# Patient Record
Sex: Male | Born: 1950 | Race: White | Hispanic: No
Health system: Southern US, Community
[De-identification: ages and names within clinical notes are randomized; demographics above are authoritative.]

## PROBLEM LIST (undated history)

## (undated) DIAGNOSIS — F419 Anxiety disorder, unspecified: Secondary | ICD-10-CM

## (undated) DIAGNOSIS — J449 Chronic obstructive pulmonary disease, unspecified: Secondary | ICD-10-CM

## (undated) DIAGNOSIS — K5909 Other constipation: Secondary | ICD-10-CM

## (undated) DIAGNOSIS — I219 Acute myocardial infarction, unspecified: Secondary | ICD-10-CM

## (undated) DIAGNOSIS — F319 Bipolar disorder, unspecified: Secondary | ICD-10-CM

## (undated) DIAGNOSIS — F329 Major depressive disorder, single episode, unspecified: Secondary | ICD-10-CM

## (undated) DIAGNOSIS — E785 Hyperlipidemia, unspecified: Secondary | ICD-10-CM

## (undated) DIAGNOSIS — I2510 Atherosclerotic heart disease of native coronary artery without angina pectoris: Secondary | ICD-10-CM

## (undated) DIAGNOSIS — I10 Essential (primary) hypertension: Secondary | ICD-10-CM

## (undated) DIAGNOSIS — N400 Enlarged prostate without lower urinary tract symptoms: Secondary | ICD-10-CM

## (undated) DIAGNOSIS — F32A Depression, unspecified: Secondary | ICD-10-CM

## (undated) HISTORY — PX: TRACHEOSTOMY: SUR1362

## (undated) HISTORY — PX: AORTIC VALVE REPLACEMENT (AVR)/CORONARY ARTERY BYPASS GRAFTING (CABG): SHX5725

## (undated) HISTORY — PX: CHOLECYSTECTOMY: SHX55

## (undated) HISTORY — PX: GASTROSTOMY: SHX151

## (undated) HISTORY — PX: NEPHRECTOMY: SHX65

---

## 2016-12-08 ENCOUNTER — Other Ambulatory Visit (HOSPITAL_COMMUNITY): Payer: Medicaid Other

## 2016-12-08 ENCOUNTER — Inpatient Hospital Stay
Admission: RE | Admit: 2016-12-08 | Discharge: 2017-01-17 | Disposition: A | Discharge status: Other Acute Inpatient Hospital | Payer: Medicaid Other | Attending: Internal Medicine | Admitting: Internal Medicine

## 2016-12-08 DIAGNOSIS — R1110 Vomiting, unspecified: Secondary | ICD-10-CM

## 2016-12-08 DIAGNOSIS — Z931 Gastrostomy status: Secondary | ICD-10-CM

## 2016-12-08 DIAGNOSIS — K567 Ileus, unspecified: Secondary | ICD-10-CM

## 2016-12-08 DIAGNOSIS — J9690 Respiratory failure, unspecified, unspecified whether with hypoxia or hypercapnia: Secondary | ICD-10-CM

## 2016-12-08 LAB — BLOOD GAS, ARTERIAL
ACID-BASE EXCESS: 3 mmol/L — AB (ref 0.0–2.0)
BICARBONATE: 26.6 mmol/L (ref 20.0–28.0)
FIO2: 28
LHR: 15 {breaths}/min
O2 Saturation: 94.1 %
PEEP: 5 cmH2O
PO2 ART: 73.4 mmHg — AB (ref 83.0–108.0)
Patient temperature: 99.2
VT: 460 mL
pCO2 arterial: 38.7 mmHg (ref 32.0–48.0)
pH, Arterial: 7.453 — ABNORMAL HIGH (ref 7.350–7.450)

## 2016-12-08 MED ORDER — IOPAMIDOL (ISOVUE-300) INJECTION 61%
50.0000 mL | Freq: Once | INTRAVENOUS | Status: AC | PRN
  Administered 2016-12-08: 50 mL

## 2016-12-08 MED ORDER — IOPAMIDOL (ISOVUE-300) INJECTION 61%
INTRAVENOUS | Status: AC
  Filled 2016-12-08: qty 150

## 2016-12-09 LAB — URINALYSIS, ROUTINE W REFLEX MICROSCOPIC
BILIRUBIN URINE: NEGATIVE
Glucose, UA: NEGATIVE mg/dL
HGB URINE DIPSTICK: NEGATIVE
Ketones, ur: NEGATIVE mg/dL
Nitrite: NEGATIVE
Protein, ur: NEGATIVE mg/dL
Specific Gravity, Urine: 1.017 (ref 1.005–1.030)
pH: 6 (ref 5.0–8.0)

## 2016-12-09 LAB — CBC WITH DIFFERENTIAL/PLATELET
BASOS PCT: 0 %
Basophils Absolute: 0 10*3/uL (ref 0.0–0.1)
EOS PCT: 6 %
Eosinophils Absolute: 0.4 10*3/uL (ref 0.0–0.7)
HEMATOCRIT: 31.6 % — AB (ref 39.0–52.0)
HEMOGLOBIN: 10 g/dL — AB (ref 13.0–17.0)
LYMPHS PCT: 13 %
Lymphs Abs: 0.9 10*3/uL (ref 0.7–4.0)
MCH: 31.4 pg (ref 26.0–34.0)
MCHC: 31.6 g/dL (ref 30.0–36.0)
MCV: 99.4 fL (ref 78.0–100.0)
MONOS PCT: 9 %
Monocytes Absolute: 0.7 10*3/uL (ref 0.1–1.0)
NEUTROS ABS: 5.3 10*3/uL (ref 1.7–7.7)
Neutrophils Relative %: 72 %
Platelets: 135 10*3/uL — ABNORMAL LOW (ref 150–400)
RBC: 3.18 MIL/uL — ABNORMAL LOW (ref 4.22–5.81)
RDW: 16 % — ABNORMAL HIGH (ref 11.5–15.5)
WBC: 7.3 10*3/uL (ref 4.0–10.5)

## 2016-12-09 LAB — PROTIME-INR
INR: 1.34
Prothrombin Time: 16.5 seconds — ABNORMAL HIGH (ref 11.4–15.2)

## 2016-12-09 LAB — COMPREHENSIVE METABOLIC PANEL
ALBUMIN: 2.8 g/dL — AB (ref 3.5–5.0)
ALT: 59 U/L (ref 17–63)
ANION GAP: 9 (ref 5–15)
AST: 55 U/L — ABNORMAL HIGH (ref 15–41)
Alkaline Phosphatase: 67 U/L (ref 38–126)
BILIRUBIN TOTAL: 1 mg/dL (ref 0.3–1.2)
BUN: 31 mg/dL — ABNORMAL HIGH (ref 6–20)
CHLORIDE: 109 mmol/L (ref 101–111)
CO2: 23 mmol/L (ref 22–32)
Calcium: 11.7 mg/dL — ABNORMAL HIGH (ref 8.9–10.3)
Creatinine, Ser: 0.55 mg/dL — ABNORMAL LOW (ref 0.61–1.24)
Glucose, Bld: 97 mg/dL (ref 65–99)
POTASSIUM: 4.7 mmol/L (ref 3.5–5.1)
SODIUM: 141 mmol/L (ref 135–145)
TOTAL PROTEIN: 6.1 g/dL — AB (ref 6.5–8.1)

## 2016-12-09 LAB — HEMOGLOBIN A1C
HEMOGLOBIN A1C: 5.2 % (ref 4.8–5.6)
MEAN PLASMA GLUCOSE: 102.54 mg/dL

## 2016-12-09 LAB — C DIFFICILE QUICK SCREEN W PCR REFLEX
C DIFFICLE (CDIFF) ANTIGEN: NEGATIVE
C Diff interpretation: NOT DETECTED
C Diff toxin: NEGATIVE

## 2016-12-09 LAB — MAGNESIUM: MAGNESIUM: 1.8 mg/dL (ref 1.7–2.4)

## 2016-12-09 LAB — TSH: TSH: 2.141 u[IU]/mL (ref 0.350–4.500)

## 2016-12-09 LAB — PHOSPHORUS: Phosphorus: 1.8 mg/dL — ABNORMAL LOW (ref 2.5–4.6)

## 2016-12-10 LAB — BASIC METABOLIC PANEL
Anion gap: 7 (ref 5–15)
BUN: 39 mg/dL — ABNORMAL HIGH (ref 6–20)
CHLORIDE: 109 mmol/L (ref 101–111)
CO2: 25 mmol/L (ref 22–32)
CREATININE: 0.6 mg/dL — AB (ref 0.61–1.24)
Calcium: 11.6 mg/dL — ABNORMAL HIGH (ref 8.9–10.3)
Glucose, Bld: 108 mg/dL — ABNORMAL HIGH (ref 65–99)
Potassium: 4.8 mmol/L (ref 3.5–5.1)
SODIUM: 141 mmol/L (ref 135–145)

## 2016-12-10 LAB — CBC
HCT: 30.7 % — ABNORMAL LOW (ref 39.0–52.0)
Hemoglobin: 9.4 g/dL — ABNORMAL LOW (ref 13.0–17.0)
MCH: 30.5 pg (ref 26.0–34.0)
MCHC: 30.6 g/dL (ref 30.0–36.0)
MCV: 99.7 fL (ref 78.0–100.0)
PLATELETS: 119 10*3/uL — AB (ref 150–400)
RBC: 3.08 MIL/uL — AB (ref 4.22–5.81)
RDW: 15.5 % (ref 11.5–15.5)
WBC: 8.8 10*3/uL (ref 4.0–10.5)

## 2016-12-10 LAB — URINE CULTURE: Culture: NO GROWTH

## 2016-12-11 LAB — BASIC METABOLIC PANEL
Anion gap: 7 (ref 5–15)
BUN: 36 mg/dL — ABNORMAL HIGH (ref 6–20)
CALCIUM: 12.3 mg/dL — AB (ref 8.9–10.3)
CO2: 27 mmol/L (ref 22–32)
CREATININE: 0.65 mg/dL (ref 0.61–1.24)
Chloride: 108 mmol/L (ref 101–111)
Glucose, Bld: 141 mg/dL — ABNORMAL HIGH (ref 65–99)
Potassium: 4.2 mmol/L (ref 3.5–5.1)
SODIUM: 142 mmol/L (ref 135–145)

## 2016-12-11 LAB — MAGNESIUM: Magnesium: 2 mg/dL (ref 1.7–2.4)

## 2016-12-11 LAB — CBC
HEMATOCRIT: 32.5 % — AB (ref 39.0–52.0)
Hemoglobin: 10.1 g/dL — ABNORMAL LOW (ref 13.0–17.0)
MCH: 31.1 pg (ref 26.0–34.0)
MCHC: 31.1 g/dL (ref 30.0–36.0)
MCV: 100 fL (ref 78.0–100.0)
PLATELETS: 140 10*3/uL — AB (ref 150–400)
RBC: 3.25 MIL/uL — ABNORMAL LOW (ref 4.22–5.81)
RDW: 15.6 % — AB (ref 11.5–15.5)
WBC: 9.8 10*3/uL (ref 4.0–10.5)

## 2016-12-11 LAB — CULTURE, RESPIRATORY W GRAM STAIN

## 2016-12-11 LAB — CK TOTAL AND CKMB (NOT AT ARMC)
CK TOTAL: 16 U/L — AB (ref 49–397)
CK, MB: 1.8 ng/mL (ref 0.5–5.0)
RELATIVE INDEX: INVALID (ref 0.0–2.5)

## 2016-12-11 LAB — CULTURE, RESPIRATORY

## 2016-12-11 MED FILL — Medication: Qty: 1 | Status: AC

## 2016-12-13 ENCOUNTER — Other Ambulatory Visit (HOSPITAL_COMMUNITY): Payer: Medicaid Other

## 2016-12-13 LAB — BASIC METABOLIC PANEL
Anion gap: 9 (ref 5–15)
Anion gap: 9 (ref 5–15)
BUN: 43 mg/dL — AB (ref 6–20)
BUN: 47 mg/dL — AB (ref 6–20)
CALCIUM: 13 mg/dL — AB (ref 8.9–10.3)
CHLORIDE: 100 mmol/L — AB (ref 101–111)
CO2: 36 mmol/L — ABNORMAL HIGH (ref 22–32)
CO2: 37 mmol/L — ABNORMAL HIGH (ref 22–32)
CREATININE: 0.78 mg/dL (ref 0.61–1.24)
Calcium: 12.5 mg/dL — ABNORMAL HIGH (ref 8.9–10.3)
Chloride: 97 mmol/L — ABNORMAL LOW (ref 101–111)
Creatinine, Ser: 1.05 mg/dL (ref 0.61–1.24)
GFR calc Af Amer: >60 mL/min (ref >60)
GLUCOSE: 127 mg/dL — AB (ref 65–99)
Glucose, Bld: 120 mg/dL — ABNORMAL HIGH (ref 65–99)
POTASSIUM: 3.3 mmol/L — AB (ref 3.5–5.1)
Potassium: 3.8 mmol/L (ref 3.5–5.1)
SODIUM: 145 mmol/L (ref 135–145)
Sodium: 143 mmol/L (ref 135–145)

## 2016-12-13 LAB — URINALYSIS, ROUTINE W REFLEX MICROSCOPIC
BILIRUBIN URINE: NEGATIVE
Glucose, UA: NEGATIVE mg/dL
Hgb urine dipstick: NEGATIVE
Ketones, ur: NEGATIVE mg/dL
Nitrite: NEGATIVE
Protein, ur: 100 mg/dL — AB
SPECIFIC GRAVITY, URINE: 1.021 (ref 1.005–1.030)
Squamous Epithelial / LPF: NONE SEEN
pH: 5 (ref 5.0–8.0)

## 2016-12-13 LAB — MAGNESIUM: Magnesium: 1.9 mg/dL (ref 1.7–2.4)

## 2016-12-13 LAB — CBC
HCT: 34.5 % — ABNORMAL LOW (ref 39.0–52.0)
Hemoglobin: 10.5 g/dL — ABNORMAL LOW (ref 13.0–17.0)
MCH: 30.3 pg (ref 26.0–34.0)
MCHC: 30.4 g/dL (ref 30.0–36.0)
MCV: 99.4 fL (ref 78.0–100.0)
PLATELETS: 217 10*3/uL (ref 150–400)
RBC: 3.47 MIL/uL — ABNORMAL LOW (ref 4.22–5.81)
RDW: 15.5 % (ref 11.5–15.5)
WBC: 10.7 10*3/uL — ABNORMAL HIGH (ref 4.0–10.5)

## 2016-12-13 MED ORDER — IOPAMIDOL (ISOVUE-300) INJECTION 61%
100.0000 mL | Freq: Once | INTRAVENOUS | Status: AC | PRN
  Administered 2016-12-13: 100 mL via INTRAVENOUS

## 2016-12-14 ENCOUNTER — Other Ambulatory Visit (HOSPITAL_COMMUNITY): Payer: Medicaid Other

## 2016-12-14 LAB — URINE CULTURE: CULTURE: NO GROWTH

## 2016-12-14 LAB — MAGNESIUM: Magnesium: 2 mg/dL (ref 1.7–2.4)

## 2016-12-15 ENCOUNTER — Other Ambulatory Visit (HOSPITAL_COMMUNITY): Payer: Medicaid Other

## 2016-12-15 LAB — BASIC METABOLIC PANEL
ANION GAP: 9 (ref 5–15)
BUN: 40 mg/dL — AB (ref 6–20)
CALCIUM: 11.9 mg/dL — AB (ref 8.9–10.3)
CO2: 38 mmol/L — ABNORMAL HIGH (ref 22–32)
Chloride: 97 mmol/L — ABNORMAL LOW (ref 101–111)
Creatinine, Ser: 0.85 mg/dL (ref 0.61–1.24)
GLUCOSE: 110 mg/dL — AB (ref 65–99)
POTASSIUM: 3.2 mmol/L — AB (ref 3.5–5.1)
SODIUM: 144 mmol/L (ref 135–145)

## 2016-12-15 LAB — CULTURE, BLOOD (ROUTINE X 2)
Culture: NO GROWTH
Culture: NO GROWTH
SPECIAL REQUESTS: ADEQUATE
Special Requests: ADEQUATE

## 2016-12-15 LAB — MAGNESIUM: MAGNESIUM: 1.9 mg/dL (ref 1.7–2.4)

## 2016-12-16 ENCOUNTER — Other Ambulatory Visit (HOSPITAL_COMMUNITY): Payer: Medicaid Other

## 2016-12-16 LAB — OCCULT BLOOD X 1 CARD TO LAB, STOOL: FECAL OCCULT BLD: NEGATIVE

## 2016-12-16 LAB — BASIC METABOLIC PANEL
ANION GAP: 8 (ref 5–15)
BUN: 31 mg/dL — ABNORMAL HIGH (ref 6–20)
CHLORIDE: 98 mmol/L — AB (ref 101–111)
CO2: 34 mmol/L — ABNORMAL HIGH (ref 22–32)
Calcium: 11.2 mg/dL — ABNORMAL HIGH (ref 8.9–10.3)
Creatinine, Ser: 0.83 mg/dL (ref 0.61–1.24)
GFR calc Af Amer: >60 mL/min (ref >60)
Glucose, Bld: 94 mg/dL (ref 65–99)
POTASSIUM: 2.9 mmol/L — AB (ref 3.5–5.1)
SODIUM: 140 mmol/L (ref 135–145)

## 2016-12-16 LAB — MAGNESIUM
MAGNESIUM: 1.8 mg/dL (ref 1.7–2.4)
MAGNESIUM: 1.9 mg/dL (ref 1.7–2.4)

## 2016-12-16 LAB — POTASSIUM: POTASSIUM: 4.1 mmol/L (ref 3.5–5.1)

## 2016-12-17 LAB — CBC
HEMATOCRIT: 27.5 % — AB (ref 39.0–52.0)
HEMOGLOBIN: 8.6 g/dL — AB (ref 13.0–17.0)
MCH: 30.9 pg (ref 26.0–34.0)
MCHC: 31.3 g/dL (ref 30.0–36.0)
MCV: 98.9 fL (ref 78.0–100.0)
Platelets: 170 10*3/uL (ref 150–400)
RBC: 2.78 MIL/uL — ABNORMAL LOW (ref 4.22–5.81)
RDW: 15.6 % — AB (ref 11.5–15.5)
WBC: 7.5 10*3/uL (ref 4.0–10.5)

## 2016-12-17 LAB — BASIC METABOLIC PANEL
Anion gap: 8 (ref 5–15)
BUN: 24 mg/dL — AB (ref 6–20)
CHLORIDE: 99 mmol/L — AB (ref 101–111)
CO2: 32 mmol/L (ref 22–32)
CREATININE: 0.91 mg/dL (ref 0.61–1.24)
Calcium: 11.1 mg/dL — ABNORMAL HIGH (ref 8.9–10.3)
GFR calc Af Amer: >60 mL/min (ref >60)
GFR calc non Af Amer: >60 mL/min (ref >60)
GLUCOSE: 89 mg/dL (ref 65–99)
Potassium: 3.4 mmol/L — ABNORMAL LOW (ref 3.5–5.1)
Sodium: 139 mmol/L (ref 135–145)

## 2016-12-18 LAB — BASIC METABOLIC PANEL
ANION GAP: 6 (ref 5–15)
BUN: 24 mg/dL — AB (ref 6–20)
CALCIUM: 11.3 mg/dL — AB (ref 8.9–10.3)
CO2: 33 mmol/L — AB (ref 22–32)
Chloride: 101 mmol/L (ref 101–111)
Creatinine, Ser: 0.91 mg/dL (ref 0.61–1.24)
GFR calc Af Amer: >60 mL/min (ref >60)
GFR calc non Af Amer: >60 mL/min (ref >60)
GLUCOSE: 100 mg/dL — AB (ref 65–99)
Potassium: 4 mmol/L (ref 3.5–5.1)
Sodium: 140 mmol/L (ref 135–145)

## 2016-12-19 LAB — BASIC METABOLIC PANEL
ANION GAP: 7 (ref 5–15)
BUN: 21 mg/dL — AB (ref 6–20)
CHLORIDE: 102 mmol/L (ref 101–111)
CO2: 28 mmol/L (ref 22–32)
CREATININE: 0.66 mg/dL (ref 0.61–1.24)
Calcium: 10.8 mg/dL — ABNORMAL HIGH (ref 8.9–10.3)
GFR calc non Af Amer: >60 mL/min (ref >60)
GLUCOSE: 105 mg/dL — AB (ref 65–99)
Potassium: 3.5 mmol/L (ref 3.5–5.1)
SODIUM: 137 mmol/L (ref 135–145)

## 2016-12-19 LAB — CBC
HCT: 26.5 % — ABNORMAL LOW (ref 39.0–52.0)
HEMOGLOBIN: 8.2 g/dL — AB (ref 13.0–17.0)
MCH: 30.4 pg (ref 26.0–34.0)
MCHC: 30.9 g/dL (ref 30.0–36.0)
MCV: 98.1 fL (ref 78.0–100.0)
Platelets: 130 10*3/uL — ABNORMAL LOW (ref 150–400)
RBC: 2.7 MIL/uL — ABNORMAL LOW (ref 4.22–5.81)
RDW: 16 % — AB (ref 11.5–15.5)
WBC: 6.9 10*3/uL (ref 4.0–10.5)

## 2016-12-19 LAB — C DIFFICILE QUICK SCREEN W PCR REFLEX
C DIFFICILE (CDIFF) INTERP: NOT DETECTED
C Diff antigen: NEGATIVE
C Diff toxin: NEGATIVE

## 2016-12-19 LAB — MAGNESIUM: Magnesium: 1.5 mg/dL — ABNORMAL LOW (ref 1.7–2.4)

## 2016-12-19 LAB — PHOSPHORUS: Phosphorus: 1.8 mg/dL — ABNORMAL LOW (ref 2.5–4.6)

## 2016-12-20 LAB — CBC
HEMATOCRIT: 18.5 % — AB (ref 39.0–52.0)
Hemoglobin: 5.9 g/dL — CL (ref 13.0–17.0)
MCH: 31.1 pg (ref 26.0–34.0)
MCHC: 31.9 g/dL (ref 30.0–36.0)
MCV: 97.4 fL (ref 78.0–100.0)
Platelets: 142 10*3/uL — ABNORMAL LOW (ref 150–400)
RBC: 1.9 MIL/uL — AB (ref 4.22–5.81)
RDW: 16.3 % — ABNORMAL HIGH (ref 11.5–15.5)
WBC: 7.4 10*3/uL (ref 4.0–10.5)

## 2016-12-20 LAB — BASIC METABOLIC PANEL
ANION GAP: 6 (ref 5–15)
BUN: 39 mg/dL — ABNORMAL HIGH (ref 6–20)
CHLORIDE: 104 mmol/L (ref 101–111)
CO2: 27 mmol/L (ref 22–32)
Calcium: 10.4 mg/dL — ABNORMAL HIGH (ref 8.9–10.3)
Creatinine, Ser: 0.94 mg/dL (ref 0.61–1.24)
GFR calc Af Amer: >60 mL/min (ref >60)
GFR calc non Af Amer: >60 mL/min (ref >60)
GLUCOSE: 108 mg/dL — AB (ref 65–99)
POTASSIUM: 3 mmol/L — AB (ref 3.5–5.1)
Sodium: 137 mmol/L (ref 135–145)

## 2016-12-20 LAB — MAGNESIUM: Magnesium: 1.9 mg/dL (ref 1.7–2.4)

## 2016-12-20 LAB — PHOSPHORUS: Phosphorus: 2.5 mg/dL (ref 2.5–4.6)

## 2016-12-20 LAB — OCCULT BLOOD X 1 CARD TO LAB, STOOL: Fecal Occult Bld: POSITIVE — AB

## 2016-12-20 LAB — ABO/RH: ABO/RH(D): AB POS

## 2016-12-20 LAB — PREPARE RBC (CROSSMATCH)

## 2016-12-21 LAB — CBC
HEMATOCRIT: 25.4 % — AB (ref 39.0–52.0)
HEMOGLOBIN: 8.2 g/dL — AB (ref 13.0–17.0)
MCH: 29.8 pg (ref 26.0–34.0)
MCHC: 32.3 g/dL (ref 30.0–36.0)
MCV: 92.4 fL (ref 78.0–100.0)
Platelets: 134 10*3/uL — ABNORMAL LOW (ref 150–400)
RBC: 2.75 MIL/uL — ABNORMAL LOW (ref 4.22–5.81)
RDW: 17.6 % — ABNORMAL HIGH (ref 11.5–15.5)
WBC: 8.4 10*3/uL (ref 4.0–10.5)

## 2016-12-21 LAB — BASIC METABOLIC PANEL
Anion gap: 6 (ref 5–15)
BUN: 57 mg/dL — ABNORMAL HIGH (ref 6–20)
CALCIUM: 10.9 mg/dL — AB (ref 8.9–10.3)
CHLORIDE: 106 mmol/L (ref 101–111)
CO2: 27 mmol/L (ref 22–32)
CREATININE: 1.06 mg/dL (ref 0.61–1.24)
GFR calc Af Amer: >60 mL/min (ref >60)
GFR calc non Af Amer: >60 mL/min (ref >60)
GLUCOSE: 108 mg/dL — AB (ref 65–99)
Potassium: 3.6 mmol/L (ref 3.5–5.1)
SODIUM: 139 mmol/L (ref 135–145)

## 2016-12-21 LAB — PHOSPHORUS: PHOSPHORUS: 2 mg/dL — AB (ref 2.5–4.6)

## 2016-12-21 LAB — CULTURE, RESPIRATORY W GRAM STAIN: Culture: NORMAL

## 2016-12-21 LAB — CULTURE, RESPIRATORY

## 2016-12-21 LAB — MAGNESIUM: Magnesium: 1.8 mg/dL (ref 1.7–2.4)

## 2016-12-22 LAB — CBC
HEMATOCRIT: 23.1 % — AB (ref 39.0–52.0)
Hemoglobin: 7.4 g/dL — ABNORMAL LOW (ref 13.0–17.0)
MCH: 30 pg (ref 26.0–34.0)
MCHC: 32 g/dL (ref 30.0–36.0)
MCV: 93.5 fL (ref 78.0–100.0)
Platelets: 128 10*3/uL — ABNORMAL LOW (ref 150–400)
RBC: 2.47 MIL/uL — AB (ref 4.22–5.81)
RDW: 18.1 % — AB (ref 11.5–15.5)
WBC: 7.3 10*3/uL (ref 4.0–10.5)

## 2016-12-22 LAB — BASIC METABOLIC PANEL
ANION GAP: 6 (ref 5–15)
BUN: 68 mg/dL — ABNORMAL HIGH (ref 6–20)
CALCIUM: 11.4 mg/dL — AB (ref 8.9–10.3)
CO2: 26 mmol/L (ref 22–32)
Chloride: 108 mmol/L (ref 101–111)
Creatinine, Ser: 1.09 mg/dL (ref 0.61–1.24)
Glucose, Bld: 119 mg/dL — ABNORMAL HIGH (ref 65–99)
POTASSIUM: 4 mmol/L (ref 3.5–5.1)
Sodium: 140 mmol/L (ref 135–145)

## 2016-12-22 LAB — MAGNESIUM: Magnesium: 1.7 mg/dL (ref 1.7–2.4)

## 2016-12-22 LAB — PHOSPHORUS: PHOSPHORUS: 2.3 mg/dL — AB (ref 2.5–4.6)

## 2016-12-23 LAB — MAGNESIUM: Magnesium: 1.7 mg/dL (ref 1.7–2.4)

## 2016-12-23 LAB — BASIC METABOLIC PANEL
Anion gap: 6 (ref 5–15)
BUN: 56 mg/dL — AB (ref 6–20)
CALCIUM: 11.5 mg/dL — AB (ref 8.9–10.3)
CHLORIDE: 109 mmol/L (ref 101–111)
CO2: 29 mmol/L (ref 22–32)
CREATININE: 1.17 mg/dL (ref 0.61–1.24)
GFR calc Af Amer: >60 mL/min (ref >60)
GFR calc non Af Amer: >60 mL/min (ref >60)
GLUCOSE: 120 mg/dL — AB (ref 65–99)
Potassium: 4.4 mmol/L (ref 3.5–5.1)
Sodium: 144 mmol/L (ref 135–145)

## 2016-12-23 LAB — CBC
HCT: 21.5 % — ABNORMAL LOW (ref 39.0–52.0)
HEMOGLOBIN: 6.9 g/dL — AB (ref 13.0–17.0)
MCH: 30.5 pg (ref 26.0–34.0)
MCHC: 32.1 g/dL (ref 30.0–36.0)
MCV: 95.1 fL (ref 78.0–100.0)
Platelets: 106 10*3/uL — ABNORMAL LOW (ref 150–400)
RBC: 2.26 MIL/uL — ABNORMAL LOW (ref 4.22–5.81)
RDW: 18 % — ABNORMAL HIGH (ref 11.5–15.5)
WBC: 6 10*3/uL (ref 4.0–10.5)

## 2016-12-23 LAB — PREPARE RBC (CROSSMATCH)

## 2016-12-24 LAB — BASIC METABOLIC PANEL
ANION GAP: 8 (ref 5–15)
BUN: 55 mg/dL — AB (ref 6–20)
CALCIUM: 11.4 mg/dL — AB (ref 8.9–10.3)
CO2: 25 mmol/L (ref 22–32)
Chloride: 111 mmol/L (ref 101–111)
Creatinine, Ser: 1.02 mg/dL (ref 0.61–1.24)
GFR calc Af Amer: >60 mL/min (ref >60)
GLUCOSE: 112 mg/dL — AB (ref 65–99)
POTASSIUM: 4.3 mmol/L (ref 3.5–5.1)
Sodium: 144 mmol/L (ref 135–145)

## 2016-12-24 LAB — TYPE AND SCREEN
ABO/RH(D): AB POS
Antibody Screen: NEGATIVE
UNIT DIVISION: 0
UNIT DIVISION: 0
UNIT DIVISION: 0
Unit division: 0

## 2016-12-24 LAB — BPAM RBC
BLOOD PRODUCT EXPIRATION DATE: 201812082359
BLOOD PRODUCT EXPIRATION DATE: 201812082359
Blood Product Expiration Date: 201812052359
Blood Product Expiration Date: 201812222359
ISSUE DATE / TIME: 201811281214
ISSUE DATE / TIME: 201812011258
ISSUE DATE / TIME: 201811281935
ISSUE DATE / TIME: 201812011655
UNIT TYPE AND RH: 8400
UNIT TYPE AND RH: 8400
Unit Type and Rh: 8400
Unit Type and Rh: 8400

## 2016-12-24 LAB — PHOSPHORUS: Phosphorus: 1.7 mg/dL — ABNORMAL LOW (ref 2.5–4.6)

## 2016-12-24 LAB — CBC
HCT: 29.4 % — ABNORMAL LOW (ref 39.0–52.0)
HEMOGLOBIN: 9.4 g/dL — AB (ref 13.0–17.0)
MCH: 29.9 pg (ref 26.0–34.0)
MCHC: 32 g/dL (ref 30.0–36.0)
MCV: 93.6 fL (ref 78.0–100.0)
PLATELETS: 132 10*3/uL — AB (ref 150–400)
RBC: 3.14 MIL/uL — AB (ref 4.22–5.81)
RDW: 18.4 % — AB (ref 11.5–15.5)
WBC: 8.5 10*3/uL (ref 4.0–10.5)

## 2016-12-24 LAB — URINALYSIS, ROUTINE W REFLEX MICROSCOPIC
BACTERIA UA: NONE SEEN
Bilirubin Urine: NEGATIVE
Glucose, UA: NEGATIVE mg/dL
Ketones, ur: NEGATIVE mg/dL
Nitrite: NEGATIVE
PROTEIN: NEGATIVE mg/dL
SPECIFIC GRAVITY, URINE: 1.017 (ref 1.005–1.030)
Squamous Epithelial / LPF: NONE SEEN
pH: 6 (ref 5.0–8.0)

## 2016-12-24 LAB — C-REACTIVE PROTEIN: CRP: 11.8 mg/dL — ABNORMAL HIGH (ref <1.0)

## 2016-12-24 LAB — MAGNESIUM: Magnesium: 1.8 mg/dL (ref 1.7–2.4)

## 2016-12-24 LAB — SEDIMENTATION RATE: SED RATE: 75 mm/h — AB (ref 0–16)

## 2016-12-25 LAB — CBC
HEMATOCRIT: 31.6 % — AB (ref 39.0–52.0)
Hemoglobin: 9.8 g/dL — ABNORMAL LOW (ref 13.0–17.0)
MCH: 30.1 pg (ref 26.0–34.0)
MCHC: 31 g/dL (ref 30.0–36.0)
MCV: 96.9 fL (ref 78.0–100.0)
PLATELETS: 116 10*3/uL — AB (ref 150–400)
RBC: 3.26 MIL/uL — ABNORMAL LOW (ref 4.22–5.81)
RDW: 18.4 % — AB (ref 11.5–15.5)
WBC: 8 10*3/uL (ref 4.0–10.5)

## 2016-12-25 LAB — BLOOD CULTURE ID PANEL (REFLEXED)
Acinetobacter baumannii: NOT DETECTED
CANDIDA GLABRATA: NOT DETECTED
CANDIDA KRUSEI: NOT DETECTED
CANDIDA TROPICALIS: NOT DETECTED
CARBAPENEM RESISTANCE: NOT DETECTED
Candida albicans: NOT DETECTED
Candida parapsilosis: NOT DETECTED
ESCHERICHIA COLI: DETECTED — AB
Enterobacter cloacae complex: NOT DETECTED
Enterobacteriaceae species: DETECTED — AB
Enterococcus species: NOT DETECTED
Haemophilus influenzae: NOT DETECTED
KLEBSIELLA OXYTOCA: NOT DETECTED
Klebsiella pneumoniae: NOT DETECTED
Listeria monocytogenes: NOT DETECTED
Neisseria meningitidis: NOT DETECTED
PROTEUS SPECIES: NOT DETECTED
Pseudomonas aeruginosa: NOT DETECTED
SERRATIA MARCESCENS: NOT DETECTED
STAPHYLOCOCCUS SPECIES: NOT DETECTED
Staphylococcus aureus (BCID): NOT DETECTED
Streptococcus agalactiae: NOT DETECTED
Streptococcus pneumoniae: NOT DETECTED
Streptococcus pyogenes: NOT DETECTED
Streptococcus species: NOT DETECTED

## 2016-12-25 LAB — C-REACTIVE PROTEIN: CRP: 12.7 mg/dL — ABNORMAL HIGH (ref <1.0)

## 2016-12-25 LAB — BASIC METABOLIC PANEL
Anion gap: 8 (ref 5–15)
BUN: 61 mg/dL — AB (ref 6–20)
CO2: 26 mmol/L (ref 22–32)
CREATININE: 1.11 mg/dL (ref 0.61–1.24)
Calcium: 12 mg/dL — ABNORMAL HIGH (ref 8.9–10.3)
Chloride: 113 mmol/L — ABNORMAL HIGH (ref 101–111)
GFR calc Af Amer: >60 mL/min (ref >60)
GLUCOSE: 102 mg/dL — AB (ref 65–99)
POTASSIUM: 3.8 mmol/L (ref 3.5–5.1)
SODIUM: 147 mmol/L — AB (ref 135–145)

## 2016-12-25 LAB — SEDIMENTATION RATE: SED RATE: 67 mm/h — AB (ref 0–16)

## 2016-12-25 LAB — MAGNESIUM: MAGNESIUM: 1.9 mg/dL (ref 1.7–2.4)

## 2016-12-25 LAB — PHOSPHORUS: PHOSPHORUS: 2 mg/dL — AB (ref 2.5–4.6)

## 2016-12-26 LAB — CULTURE, RESPIRATORY

## 2016-12-26 LAB — BASIC METABOLIC PANEL
Anion gap: 9 (ref 5–15)
BUN: 76 mg/dL — AB (ref 6–20)
CHLORIDE: 107 mmol/L (ref 101–111)
CO2: 28 mmol/L (ref 22–32)
CREATININE: 1.25 mg/dL — AB (ref 0.61–1.24)
Calcium: 11.8 mg/dL — ABNORMAL HIGH (ref 8.9–10.3)
GFR calc Af Amer: >60 mL/min (ref >60)
GFR calc non Af Amer: 58 mL/min — ABNORMAL LOW (ref >60)
Glucose, Bld: 123 mg/dL — ABNORMAL HIGH (ref 65–99)
Potassium: 3.6 mmol/L (ref 3.5–5.1)
SODIUM: 144 mmol/L (ref 135–145)

## 2016-12-26 LAB — CBC
HCT: 25.9 % — ABNORMAL LOW (ref 39.0–52.0)
HEMOGLOBIN: 7.9 g/dL — AB (ref 13.0–17.0)
MCH: 29.4 pg (ref 26.0–34.0)
MCHC: 30.5 g/dL (ref 30.0–36.0)
MCV: 96.3 fL (ref 78.0–100.0)
Platelets: 108 10*3/uL — ABNORMAL LOW (ref 150–400)
RBC: 2.69 MIL/uL — ABNORMAL LOW (ref 4.22–5.81)
RDW: 17.8 % — ABNORMAL HIGH (ref 11.5–15.5)
WBC: 6.6 10*3/uL (ref 4.0–10.5)

## 2016-12-26 LAB — MAGNESIUM: MAGNESIUM: 1.8 mg/dL (ref 1.7–2.4)

## 2016-12-26 LAB — CULTURE, RESPIRATORY W GRAM STAIN

## 2016-12-26 LAB — URINE CULTURE

## 2016-12-26 LAB — VANCOMYCIN, TROUGH: Vancomycin Tr: 47 ug/mL (ref 15–20)

## 2016-12-26 MED ORDER — GENERIC EXTERNAL MEDICATION
1.00 | Status: DC
Start: 2016-12-09 — End: 2016-12-26

## 2016-12-26 MED ORDER — GENERIC EXTERNAL MEDICATION
Status: DC
Start: 2016-12-08 — End: 2016-12-26

## 2016-12-26 MED ORDER — APIXABAN 5 MG PO TABS
5.00 mg | ORAL_TABLET | ORAL | Status: DC
Start: 2016-12-08 — End: 2016-12-26

## 2016-12-26 MED ORDER — HYDROGEN PEROXIDE 3 % EX SOLN
CUTANEOUS | Status: DC
Start: 2016-12-08 — End: 2016-12-26

## 2016-12-26 MED ORDER — MIDAZOLAM HCL 10 MG/10ML IJ SOLN
2.00 mg | INTRAMUSCULAR | Status: DC
Start: ? — End: 2016-12-26

## 2016-12-26 MED ORDER — FAMOTIDINE 20 MG PO TABS
20.00 mg | ORAL_TABLET | ORAL | Status: DC
Start: 2016-12-08 — End: 2016-12-26

## 2016-12-26 MED ORDER — INFLUENZA VAC SPLIT QUAD 0.5 ML IM SUSY
0.50 | PREFILLED_SYRINGE | INTRAMUSCULAR | Status: DC
Start: ? — End: 2016-12-26

## 2016-12-26 MED ORDER — PROPOFOL 100 MG/10ML IV EMUL
0.00 | INTRAVENOUS | Status: DC
Start: ? — End: 2016-12-26

## 2016-12-26 MED ORDER — PEPPERMINT OIL PO
10.00 | ORAL | Status: DC
Start: ? — End: 2016-12-26

## 2016-12-26 MED ORDER — GENERIC EXTERNAL MEDICATION
Status: DC
Start: ? — End: 2016-12-26

## 2016-12-26 MED ORDER — BUDESONIDE 0.5 MG/2ML IN SUSP
0.50 mg | RESPIRATORY_TRACT | Status: DC
Start: 2016-12-09 — End: 2016-12-26

## 2016-12-26 MED ORDER — CHLORHEXIDINE GLUCONATE 0.12 % MT SOLN
15.00 | OROMUCOSAL | Status: DC
Start: 2016-12-08 — End: 2016-12-26

## 2016-12-26 MED ORDER — SODIUM CHLORIDE 0.45 % IV SOLN
20.00 | INTRAVENOUS | Status: DC
Start: ? — End: 2016-12-26

## 2016-12-26 NOTE — Consult Note (Signed)
[facility] Consult: 4:01 PM 12/26/2016  LOS: 0 days    Referring Provider: Dr Sharyon MedicusHijazi  Primary Care Physician:  System, Pcp Not In Primary Gastroenterologist:  unassigned  Contact phone number is for a guardian Christopher Branch phone #[phone]   Reason for Consultation:  Gi bleed   HPI: Christopher Branch is a 66 y.o. male.   History of COPD.  CAD.  S/p CABG  MI.  Obstructive sleep apnea.  Pneumonia. GERD.   Chronic  thrombocytopenia.  Atrial fibrillation.  AK I.  Bipolar disorder.  Narcissistic personality d/o. BPH.    Urinary retention.  CVA in 10/2015. S/p stenting of the left carotid artery in 12/2015.  No previous EGD or colonoscopy per care everywhere.   Patient was found unconscious ~ 11/11/16 [place] [facility] [place]  He was transported to the local hospital where he was diagnosed with a large PE, received thrombolytic therapy and started on heparin then transitioned to Eliquis.  A fib also noted.   During the hospitalization he developed VDRF and underwent trach placement.  He has a large sacral decubitus unclear whether this was present before the hospitalization.  He has a feeding tube in place.  It looks as if he was transfused with red blood cells on 10/31 as well as 11/23/16 but can not see details.  Nodes are also made of his worsening mental status on top of a baseline of cognitive dysfunction.  A note mentions cannot rule out anoxic/hypotensive CNS insult and the long-term prognosis "remains poor ".   [facility]  On 11/28 he developed what was described as melenic stool.  Hgb to 5.9 on 11/28, got PRBC x2.  Post transfusion, Hgb up to 7.4 but dropped to 6.9 on 12/1, got 2 more PRBC. Up to 9.8 on 12/3, down to 7.9 today  12/4.   Flexi-Seal is in place and there is no significant stool or melena in the collection bag.  RN attending to the patient today says that he has not had significant stool output during today's shift.  Platelets have been low, nadir 106, 108 today.  A CT scan of the abdomen and pelvis with contrast of 11/21 showed small free intraperitoneal air consistent with recent G-tube placement and some mildly dilated small bowel loops without a transition point favoring a ileus over low-grade SBO.  The liver was normal.   Eliquis and aspirin were discontinued when the bleeding developed last week.Marland Kitchen.  He is also receiving  Patient has a large, high-grade sacral decubitus ulcer which bleeds a bit but has not displayed aggressive hemorrhaging.  No significant purpura or hematoma.  No abdominal pain.    past medical history  See HPI  Previous Surgery: Cholecystectomy.   Left nephrectomy CABG   Scheduled Meds: IV ciprofloxacin.  Humalog sliding scale insulin.  Abilify.  Atorvastatin.  Inhaled budesonide.  Fluconazole p.o.  Furosemide p.o.  Combivent inhaler.  Metoclopramide 5 mg p.o. every 8 hours.  Metoprolol.  Paroxetine.  Protein supplement.  Protonix 40 mg IV twice daily.  Probiotic supplement.  Sensipar.  Flomax.  Multivitamin with mineral supplement.  Vancomycin IV. Oxycodone as needed.    Infusions:  PRN Meds:    Allergies as of 12/08/2016  . (Not on File)    No family history on file.  Social History   Socioeconomic History  . Marital status: Unknown    Spouse name: Not on file  . Number of children: Not on file  . Years of education: Not on file  . Highest education level: Not on file  Social Needs  . Financial resource strain: Not on file  . Food insecurity - worry: Not on file  . Food insecurity - inability: Not on file  . Transportation needs - medical: Not on file  . Transportation needs - non-medical: Not on file  Occupational History  . Not on file  Tobacco Use  .  Smoking status: Not on file  Substance and Sexual Activity  . Alcohol use: Not on file  . Drug use: Not on file  . Sexual activity: Not on file  Other Topics Concern  . Not on file  Social History Narrative  . Not on file    REVIEW OF SYSTEMS: Obtunded pt who is not following commands or able to communicate so unable to obtain the review of systems.   PHYSICAL EXAM: Vital signs in last 24 hours: There were no vitals filed for this visit. Wt Readings from Last 3 Encounters:  No data found for Wt   Temperature 100.4.  Blood pressure 145/75.  Respirations 20.  Pulse 60. General: Ill looking man who looks younger than stated age. Head: No facial asymmetry or swelling.  No signs of head trauma. Eyes: No scleral icterus.  No conjunctival pallor. Ears: Unable to assess his hearing as he does not respond though his eyes are open. Nose: No discharge or congestion Mouth: No blood at the mouth. Neck: No mass.  No JVD.  Trach in place. Lungs: Clear to auscultation and front.  No labored breathing or cough. Heart: RRR.  No MRG.  S1, S2 present. Abdomen: Soft.  Not tender.  PEG site in upper left quadrant looks benign.  There is no blood coming from the PEG site..   Rectal: Flexi-Seal is in place.  There is a mostly clear liquid in the tubing with very few specks of stool.  There is no visible blood and certainly no melena or actual stool. Musc/Skeltl: No joint redness or swelling. Extremities: His feet are wrapped up in skin protective booties. Neurologic: Unresponsive though eyes are open.  Nonverbal.  Resting tremor of hands.   Skin: Has a huge sacral decubitus.  The extensive bandaging is saturated with a serous fluid which is slightly pink tinged.    Intake/Output from previous day: No intake/output data recorded. Intake/Output this shift: No intake/output data recorded.  LAB RESULTS: Recent Labs    12/24/16 0505 12/25/16 0755 12/26/16 0635  WBC 8.5 8.0 6.6  HGB 9.4* 9.8*  7.9*  HCT 29.4* 31.6* 25.9*  PLT 132* 116* 108*   BMET Lab Results  Component Value Date   NA 144 12/26/2016   NA 147 (H) 12/25/2016   NA 144 12/24/2016   K 3.6 12/26/2016   K 3.8 12/25/2016   K 4.3 12/24/2016   CL 107 12/26/2016   CL 113 (H) 12/25/2016   CL 111 12/24/2016   CO2 28 12/26/2016   CO2 26 12/25/2016   CO2 25  12/24/2016   GLUCOSE 123 (H) 12/26/2016   GLUCOSE 102 (H) 12/25/2016   GLUCOSE 112 (H) 12/24/2016   BUN 76 (H) 12/26/2016   BUN 61 (H) 12/25/2016   BUN 55 (H) 12/24/2016   CREATININE 1.25 (H) 12/26/2016   CREATININE 1.11 12/25/2016   CREATININE 1.02 12/24/2016   CALCIUM 11.8 (H) 12/26/2016   CALCIUM 12.0 (H) 12/25/2016   CALCIUM 11.4 (H) 12/24/2016   LFT No results for input(s): PROT, ALBUMIN, AST, ALT, ALKPHOS, BILITOT, BILIDIR, IBILI in the last 72 hours. PT/INR Lab Results  Component Value Date   INR 1.34 12/09/2016   Hepatitis Panel No results for input(s): HEPBSAG, HCVAB, HEPAIGM, HEPBIGM in the last 72 hours. C-Diff No components found for: CDIFF Lipase  No results found for: LIPASE  Drugs of Abuse  No results found for: LABOPIA, COCAINSCRNUR, LABBENZ, AMPHETMU, THCU, LABBARB   RADIOLOGY STUDIES: No results found.  ENDOSCOPIC STUDIES: Unable to find any previous records of upper endoscopy or colonoscopy in his care everywhere records.  IMPRESSION:   *   Anemia, normocytic, multifactorial.  *  GI bleed with melena last week.  From the exam today this appears to be resolved.  *   Large pulmonary embolus, diagnosed a month or more ago.  Was on Eliquis but this has been on hold for several days due to the GI bleed.  *  Fever.  E. coli growing from both blood and urine cultures.  On antibiotics.   *  Thrombocytopenia, no splenomegaly or liver dz on CT of 11/21  PLAN:     *Continue supportive care with the twice daily IV Protonix.  *Discussed removal of the Flexi-Seal with the patient's RN.  If these stay in more than 3 or 4  days they run the risk of causing ulcers and hematochezia.  *  Follow Hgb and transfuse PRN.     Jennye Moccasin  12/26/2016, 4:01 PM Pager: [phone]  ________________________________________________________________________  [facility] MD note:  I personally examined the patient, reviewed the data and agree with the assessment and plan described above.  Dark stools several days ago, none since then. I spoke with floor RN today who knows him quite well; he's had no dark stools since last week.  Given his frail state, multiple very serious comorbid conditions I do not recommend invasive testing at this point (colon/EGD).  Should restart blood thinners that are needed to treat his PE and observe him clinically.  Please d/c his flex seal, continue IV PPI twice daily. Call if recurrent OVERT GI bleeding and otherwise transfuse blood products as needed.   Rob Bunting, MD [facility] Pager [phone]

## 2016-12-27 DIAGNOSIS — Z7901 Long term (current) use of anticoagulants: Secondary | ICD-10-CM

## 2016-12-27 DIAGNOSIS — K921 Melena: Secondary | ICD-10-CM

## 2016-12-27 DIAGNOSIS — D649 Anemia, unspecified: Secondary | ICD-10-CM

## 2016-12-27 DIAGNOSIS — R509 Fever, unspecified: Secondary | ICD-10-CM

## 2016-12-27 DIAGNOSIS — D696 Thrombocytopenia, unspecified: Secondary | ICD-10-CM

## 2016-12-27 LAB — CULTURE, BLOOD (ROUTINE X 2)
SPECIAL REQUESTS: ADEQUATE
Special Requests: ADEQUATE

## 2016-12-27 LAB — BASIC METABOLIC PANEL
Anion gap: 10 (ref 5–15)
BUN: 73 mg/dL — AB (ref 6–20)
CO2: 25 mmol/L (ref 22–32)
Calcium: 11.8 mg/dL — ABNORMAL HIGH (ref 8.9–10.3)
Chloride: 106 mmol/L (ref 101–111)
Creatinine, Ser: 1.13 mg/dL (ref 0.61–1.24)
GFR calc Af Amer: >60 mL/min (ref >60)
GLUCOSE: 124 mg/dL — AB (ref 65–99)
POTASSIUM: 3.4 mmol/L — AB (ref 3.5–5.1)
Sodium: 141 mmol/L (ref 135–145)

## 2016-12-27 LAB — CBC
HEMATOCRIT: 25.7 % — AB (ref 39.0–52.0)
Hemoglobin: 7.9 g/dL — ABNORMAL LOW (ref 13.0–17.0)
MCH: 29.6 pg (ref 26.0–34.0)
MCHC: 30.7 g/dL (ref 30.0–36.0)
MCV: 96.3 fL (ref 78.0–100.0)
PLATELETS: 109 10*3/uL — AB (ref 150–400)
RBC: 2.67 MIL/uL — AB (ref 4.22–5.81)
RDW: 17.7 % — ABNORMAL HIGH (ref 11.5–15.5)
WBC: 5.6 10*3/uL (ref 4.0–10.5)

## 2016-12-27 LAB — VANCOMYCIN, TROUGH: Vancomycin Tr: 26 ug/mL (ref 15–20)

## 2016-12-29 LAB — VANCOMYCIN, TROUGH: VANCOMYCIN TR: 14 ug/mL — AB (ref 15–20)

## 2016-12-30 LAB — CBC
HCT: 28.1 % — ABNORMAL LOW (ref 39.0–52.0)
Hemoglobin: 8.5 g/dL — ABNORMAL LOW (ref 13.0–17.0)
MCH: 29.7 pg (ref 26.0–34.0)
MCHC: 30.2 g/dL (ref 30.0–36.0)
MCV: 98.3 fL (ref 78.0–100.0)
PLATELETS: 121 10*3/uL — AB (ref 150–400)
RBC: 2.86 MIL/uL — ABNORMAL LOW (ref 4.22–5.81)
RDW: 17.8 % — AB (ref 11.5–15.5)
WBC: 7 10*3/uL (ref 4.0–10.5)

## 2016-12-30 LAB — BASIC METABOLIC PANEL
Anion gap: 9 (ref 5–15)
BUN: 63 mg/dL — AB (ref 6–20)
CALCIUM: 12.8 mg/dL — AB (ref 8.9–10.3)
CO2: 26 mmol/L (ref 22–32)
CREATININE: 1.09 mg/dL (ref 0.61–1.24)
Chloride: 109 mmol/L (ref 101–111)
GFR calc Af Amer: >60 mL/min (ref >60)
GLUCOSE: 118 mg/dL — AB (ref 65–99)
Potassium: 3.4 mmol/L — ABNORMAL LOW (ref 3.5–5.1)
Sodium: 144 mmol/L (ref 135–145)

## 2016-12-31 LAB — POTASSIUM: Potassium: 4 mmol/L (ref 3.5–5.1)

## 2017-01-03 LAB — CBC
HCT: 23.9 % — ABNORMAL LOW (ref 39.0–52.0)
Hemoglobin: 6.9 g/dL — CL (ref 13.0–17.0)
MCH: 29.4 pg (ref 26.0–34.0)
MCHC: 28.9 g/dL — ABNORMAL LOW (ref 30.0–36.0)
MCV: 101.7 fL — ABNORMAL HIGH (ref 78.0–100.0)
PLATELETS: 107 10*3/uL — AB (ref 150–400)
RBC: 2.35 MIL/uL — AB (ref 4.22–5.81)
RDW: 18 % — ABNORMAL HIGH (ref 11.5–15.5)
WBC: 4.7 10*3/uL (ref 4.0–10.5)

## 2017-01-03 LAB — BASIC METABOLIC PANEL
Anion gap: 3 — ABNORMAL LOW (ref 5–15)
BUN: 53 mg/dL — ABNORMAL HIGH (ref 6–20)
CALCIUM: 13 mg/dL — AB (ref 8.9–10.3)
CHLORIDE: 114 mmol/L — AB (ref 101–111)
CO2: 32 mmol/L (ref 22–32)
CREATININE: 0.93 mg/dL (ref 0.61–1.24)
GFR calc Af Amer: >60 mL/min (ref >60)
GFR calc non Af Amer: >60 mL/min (ref >60)
GLUCOSE: 116 mg/dL — AB (ref 65–99)
Potassium: 4.4 mmol/L (ref 3.5–5.1)
SODIUM: 149 mmol/L — AB (ref 135–145)

## 2017-01-03 LAB — PREPARE RBC (CROSSMATCH)

## 2017-01-03 LAB — PHOSPHORUS: PHOSPHORUS: 2.3 mg/dL — AB (ref 2.5–4.6)

## 2017-01-03 LAB — MAGNESIUM: Magnesium: 1.9 mg/dL (ref 1.7–2.4)

## 2017-01-04 LAB — CBC
HCT: 35.7 % — ABNORMAL LOW (ref 39.0–52.0)
HEMOGLOBIN: 10.7 g/dL — AB (ref 13.0–17.0)
MCH: 29.3 pg (ref 26.0–34.0)
MCHC: 30 g/dL (ref 30.0–36.0)
MCV: 97.8 fL (ref 78.0–100.0)
Platelets: 136 10*3/uL — ABNORMAL LOW (ref 150–400)
RBC: 3.65 MIL/uL — AB (ref 4.22–5.81)
RDW: 17.8 % — ABNORMAL HIGH (ref 11.5–15.5)
WBC: 8.4 10*3/uL (ref 4.0–10.5)

## 2017-01-04 LAB — BASIC METABOLIC PANEL
ANION GAP: 5 (ref 5–15)
BUN: 51 mg/dL — ABNORMAL HIGH (ref 6–20)
CALCIUM: 13 mg/dL — AB (ref 8.9–10.3)
CO2: 29 mmol/L (ref 22–32)
Chloride: 115 mmol/L — ABNORMAL HIGH (ref 101–111)
Creatinine, Ser: 0.91 mg/dL (ref 0.61–1.24)
GLUCOSE: 88 mg/dL (ref 65–99)
Potassium: 4.5 mmol/L (ref 3.5–5.1)
SODIUM: 149 mmol/L — AB (ref 135–145)

## 2017-01-05 LAB — BPAM RBC
BLOOD PRODUCT EXPIRATION DATE: 201812312359
Blood Product Expiration Date: 201812312359
ISSUE DATE / TIME: 201812130117
ISSUE DATE / TIME: 201812121216
Unit Type and Rh: 8400
Unit Type and Rh: 8400

## 2017-01-05 LAB — TYPE AND SCREEN
ABO/RH(D): AB POS
Antibody Screen: NEGATIVE
UNIT DIVISION: 0
Unit division: 0

## 2017-01-05 LAB — BASIC METABOLIC PANEL
Anion gap: 5 (ref 5–15)
BUN: 52 mg/dL — AB (ref 6–20)
CHLORIDE: 111 mmol/L (ref 101–111)
CO2: 29 mmol/L (ref 22–32)
CREATININE: 0.9 mg/dL (ref 0.61–1.24)
Calcium: 13.1 mg/dL (ref 8.9–10.3)
GFR calc Af Amer: >60 mL/min (ref >60)
GFR calc non Af Amer: >60 mL/min (ref >60)
GLUCOSE: 109 mg/dL — AB (ref 65–99)
Potassium: 4.2 mmol/L (ref 3.5–5.1)
SODIUM: 145 mmol/L (ref 135–145)

## 2017-01-05 LAB — VANCOMYCIN, TROUGH: VANCOMYCIN TR: 34 ug/mL — AB (ref 15–20)

## 2017-01-06 LAB — URINALYSIS, ROUTINE W REFLEX MICROSCOPIC
Bilirubin Urine: NEGATIVE
GLUCOSE, UA: NEGATIVE mg/dL
Ketones, ur: NEGATIVE mg/dL
NITRITE: NEGATIVE
PH: 7 (ref 5.0–8.0)
Protein, ur: 30 mg/dL — AB
Specific Gravity, Urine: 1.011 (ref 1.005–1.030)
Squamous Epithelial / LPF: NONE SEEN

## 2017-01-06 LAB — RENAL FUNCTION PANEL
ANION GAP: 7 (ref 5–15)
Albumin: 1.6 g/dL — ABNORMAL LOW (ref 3.5–5.0)
BUN: 50 mg/dL — ABNORMAL HIGH (ref 6–20)
CALCIUM: 13.2 mg/dL — AB (ref 8.9–10.3)
CHLORIDE: 110 mmol/L (ref 101–111)
CO2: 29 mmol/L (ref 22–32)
CREATININE: 1.03 mg/dL (ref 0.61–1.24)
Glucose, Bld: 102 mg/dL — ABNORMAL HIGH (ref 65–99)
Phosphorus: 2.4 mg/dL — ABNORMAL LOW (ref 2.5–4.6)
Potassium: 4.3 mmol/L (ref 3.5–5.1)
SODIUM: 146 mmol/L — AB (ref 135–145)

## 2017-01-06 LAB — CBC
HCT: 30.3 % — ABNORMAL LOW (ref 39.0–52.0)
Hemoglobin: 9.3 g/dL — ABNORMAL LOW (ref 13.0–17.0)
MCH: 29.2 pg (ref 26.0–34.0)
MCHC: 30.7 g/dL (ref 30.0–36.0)
MCV: 95 fL (ref 78.0–100.0)
PLATELETS: 110 10*3/uL — AB (ref 150–400)
RBC: 3.19 MIL/uL — AB (ref 4.22–5.81)
RDW: 16.9 % — AB (ref 11.5–15.5)
WBC: 6.4 10*3/uL (ref 4.0–10.5)

## 2017-01-06 LAB — MAGNESIUM: MAGNESIUM: 1.9 mg/dL (ref 1.7–2.4)

## 2017-01-07 LAB — CBC
HEMATOCRIT: 28.2 % — AB (ref 39.0–52.0)
HEMOGLOBIN: 8.6 g/dL — AB (ref 13.0–17.0)
MCH: 29.9 pg (ref 26.0–34.0)
MCHC: 30.5 g/dL (ref 30.0–36.0)
MCV: 97.9 fL (ref 78.0–100.0)
Platelets: 98 10*3/uL — ABNORMAL LOW (ref 150–400)
RBC: 2.88 MIL/uL — AB (ref 4.22–5.81)
RDW: 16.7 % — ABNORMAL HIGH (ref 11.5–15.5)
WBC: 3.9 10*3/uL — AB (ref 4.0–10.5)

## 2017-01-07 LAB — BASIC METABOLIC PANEL
ANION GAP: 6 (ref 5–15)
BUN: 53 mg/dL — AB (ref 6–20)
CHLORIDE: 109 mmol/L (ref 101–111)
CO2: 28 mmol/L (ref 22–32)
Calcium: 13.3 mg/dL (ref 8.9–10.3)
Creatinine, Ser: 1 mg/dL (ref 0.61–1.24)
GFR calc Af Amer: >60 mL/min (ref >60)
GFR calc non Af Amer: >60 mL/min (ref >60)
GLUCOSE: 107 mg/dL — AB (ref 65–99)
POTASSIUM: 4.2 mmol/L (ref 3.5–5.1)
Sodium: 143 mmol/L (ref 135–145)

## 2017-01-07 LAB — OCCULT BLOOD X 1 CARD TO LAB, STOOL: Fecal Occult Bld: POSITIVE — AB

## 2017-01-07 LAB — URINE CULTURE: Culture: NO GROWTH

## 2017-01-07 LAB — CULTURE, BLOOD (ROUTINE X 2)
CULTURE: NO GROWTH
CULTURE: NO GROWTH
SPECIAL REQUESTS: ADEQUATE
Special Requests: ADEQUATE

## 2017-01-07 LAB — SEDIMENTATION RATE: Sed Rate: 40 mm/hr — ABNORMAL HIGH (ref 0–16)

## 2017-01-07 LAB — C DIFFICILE QUICK SCREEN W PCR REFLEX
C DIFFICILE (CDIFF) TOXIN: NEGATIVE
C Diff antigen: NEGATIVE
C Diff interpretation: NOT DETECTED

## 2017-01-07 LAB — TSH: TSH: 4.367 u[IU]/mL (ref 0.350–4.500)

## 2017-01-07 LAB — C-REACTIVE PROTEIN: CRP: 6 mg/dL — ABNORMAL HIGH (ref <1.0)

## 2017-01-08 LAB — PHOSPHORUS: PHOSPHORUS: 2.9 mg/dL (ref 2.5–4.6)

## 2017-01-08 LAB — CBC
HEMATOCRIT: 29.2 % — AB (ref 39.0–52.0)
HEMOGLOBIN: 8.7 g/dL — AB (ref 13.0–17.0)
MCH: 29 pg (ref 26.0–34.0)
MCHC: 29.8 g/dL — AB (ref 30.0–36.0)
MCV: 97.3 fL (ref 78.0–100.0)
Platelets: 98 10*3/uL — ABNORMAL LOW (ref 150–400)
RBC: 3 MIL/uL — AB (ref 4.22–5.81)
RDW: 16.5 % — ABNORMAL HIGH (ref 11.5–15.5)
WBC: 4.3 10*3/uL (ref 4.0–10.5)

## 2017-01-08 LAB — BASIC METABOLIC PANEL
Anion gap: 5 (ref 5–15)
BUN: 57 mg/dL — ABNORMAL HIGH (ref 6–20)
CHLORIDE: 111 mmol/L (ref 101–111)
CO2: 29 mmol/L (ref 22–32)
CREATININE: 0.91 mg/dL (ref 0.61–1.24)
Calcium: 13.8 mg/dL (ref 8.9–10.3)
GFR calc non Af Amer: >60 mL/min (ref >60)
Glucose, Bld: 95 mg/dL (ref 65–99)
POTASSIUM: 4.5 mmol/L (ref 3.5–5.1)
Sodium: 145 mmol/L (ref 135–145)

## 2017-01-08 LAB — PARATHYROID HORMONE, INTACT (NO CA): PTH: 158 pg/mL — AB (ref 15–65)

## 2017-01-08 LAB — MAGNESIUM: MAGNESIUM: 1.8 mg/dL (ref 1.7–2.4)

## 2017-01-09 LAB — CULTURE, RESPIRATORY: SPECIAL REQUESTS: NORMAL

## 2017-01-09 LAB — CULTURE, RESPIRATORY W GRAM STAIN

## 2017-01-10 LAB — CALCIUM, IONIZED: CALCIUM, IONIZED, SERUM: 9.3 mg/dL — AB (ref 4.5–5.6)

## 2017-01-11 LAB — VITAMIN D 25 HYDROXY (VIT D DEFICIENCY, FRACTURES): VIT D 25 HYDROXY: 38.2 ng/mL (ref 30.0–100.0)

## 2017-01-11 LAB — CULTURE, BLOOD (ROUTINE X 2)
CULTURE: NO GROWTH
CULTURE: NO GROWTH
SPECIAL REQUESTS: ADEQUATE
SPECIAL REQUESTS: ADEQUATE

## 2017-01-12 LAB — CBC
HCT: 26.7 % — ABNORMAL LOW (ref 39.0–52.0)
Hemoglobin: 8 g/dL — ABNORMAL LOW (ref 13.0–17.0)
MCH: 29.2 pg (ref 26.0–34.0)
MCHC: 30 g/dL (ref 30.0–36.0)
MCV: 97.4 fL (ref 78.0–100.0)
PLATELETS: 106 10*3/uL — AB (ref 150–400)
RBC: 2.74 MIL/uL — ABNORMAL LOW (ref 4.22–5.81)
RDW: 16.6 % — AB (ref 11.5–15.5)
WBC: 2.8 10*3/uL — ABNORMAL LOW (ref 4.0–10.5)

## 2017-01-12 LAB — BASIC METABOLIC PANEL
Anion gap: 3 — ABNORMAL LOW (ref 5–15)
BUN: 58 mg/dL — ABNORMAL HIGH (ref 6–20)
CALCIUM: 13.2 mg/dL — AB (ref 8.9–10.3)
CO2: 32 mmol/L (ref 22–32)
CREATININE: 1.06 mg/dL (ref 0.61–1.24)
Chloride: 110 mmol/L (ref 101–111)
GFR calc Af Amer: >60 mL/min (ref >60)
GFR calc non Af Amer: >60 mL/min (ref >60)
GLUCOSE: 116 mg/dL — AB (ref 65–99)
Potassium: 4 mmol/L (ref 3.5–5.1)
Sodium: 145 mmol/L (ref 135–145)

## 2017-01-12 LAB — MAGNESIUM: Magnesium: 2 mg/dL (ref 1.7–2.4)

## 2017-01-14 LAB — CBC
HEMATOCRIT: 32.8 % — AB (ref 39.0–52.0)
Hemoglobin: 10 g/dL — ABNORMAL LOW (ref 13.0–17.0)
MCH: 29.2 pg (ref 26.0–34.0)
MCHC: 30.5 g/dL (ref 30.0–36.0)
MCV: 95.6 fL (ref 78.0–100.0)
PLATELETS: 122 10*3/uL — AB (ref 150–400)
RBC: 3.43 MIL/uL — ABNORMAL LOW (ref 4.22–5.81)
RDW: 16.4 % — AB (ref 11.5–15.5)
WBC: 4 10*3/uL (ref 4.0–10.5)

## 2017-01-14 LAB — BASIC METABOLIC PANEL
ANION GAP: 4 — AB (ref 5–15)
Anion gap: 4 — ABNORMAL LOW (ref 5–15)
BUN: 58 mg/dL — AB (ref 6–20)
BUN: 57 mg/dL — AB (ref 6–20)
CALCIUM: 13.5 mg/dL — AB (ref 8.9–10.3)
CHLORIDE: 107 mmol/L (ref 101–111)
CO2: 31 mmol/L (ref 22–32)
CO2: 33 mmol/L — AB (ref 22–32)
CREATININE: 1.2 mg/dL (ref 0.61–1.24)
Calcium: 13.5 mg/dL (ref 8.9–10.3)
Chloride: 106 mmol/L (ref 101–111)
Creatinine, Ser: 1.28 mg/dL — ABNORMAL HIGH (ref 0.61–1.24)
GFR calc Af Amer: >60 mL/min (ref >60)
GFR calc Af Amer: >60 mL/min (ref >60)
GFR calc non Af Amer: >60 mL/min (ref >60)
GFR, EST NON AFRICAN AMERICAN: 56 mL/min — AB (ref >60)
GLUCOSE: 81 mg/dL (ref 65–99)
GLUCOSE: 92 mg/dL (ref 65–99)
POTASSIUM: 5.4 mmol/L — AB (ref 3.5–5.1)
POTASSIUM: 4.3 mmol/L (ref 3.5–5.1)
SODIUM: 142 mmol/L (ref 135–145)
Sodium: 143 mmol/L (ref 135–145)

## 2017-01-16 LAB — BASIC METABOLIC PANEL
ANION GAP: 6 (ref 5–15)
BUN: 58 mg/dL — ABNORMAL HIGH (ref 6–20)
CO2: 33 mmol/L — AB (ref 22–32)
Calcium: 13.7 mg/dL (ref 8.9–10.3)
Chloride: 104 mmol/L (ref 101–111)
Creatinine, Ser: 1.57 mg/dL — ABNORMAL HIGH (ref 0.61–1.24)
GFR calc Af Amer: 51 mL/min — ABNORMAL LOW (ref >60)
GFR calc non Af Amer: 44 mL/min — ABNORMAL LOW (ref >60)
GLUCOSE: 85 mg/dL (ref 65–99)
POTASSIUM: 4.2 mmol/L (ref 3.5–5.1)
Sodium: 143 mmol/L (ref 135–145)

## 2017-01-16 LAB — CBC
HEMATOCRIT: 30.4 % — AB (ref 39.0–52.0)
Hemoglobin: 9.1 g/dL — ABNORMAL LOW (ref 13.0–17.0)
MCH: 28.8 pg (ref 26.0–34.0)
MCHC: 29.9 g/dL — ABNORMAL LOW (ref 30.0–36.0)
MCV: 96.2 fL (ref 78.0–100.0)
Platelets: 130 10*3/uL — ABNORMAL LOW (ref 150–400)
RBC: 3.16 MIL/uL — ABNORMAL LOW (ref 4.22–5.81)
RDW: 16.1 % — AB (ref 11.5–15.5)
WBC: 4.7 10*3/uL (ref 4.0–10.5)

## 2017-01-16 LAB — MAGNESIUM: Magnesium: 2.1 mg/dL (ref 1.7–2.4)

## 2017-01-16 LAB — PHOSPHORUS: Phosphorus: 3.9 mg/dL (ref 2.5–4.6)

## 2017-01-17 ENCOUNTER — Encounter (HOSPITAL_COMMUNITY): Payer: Self-pay | Admitting: Emergency Medicine

## 2017-01-17 ENCOUNTER — Inpatient Hospital Stay
Admission: RE | Admit: 2017-01-17 | Discharge: 2017-01-25 | Disposition: A | Discharge status: Hospice | Payer: Medicaid Other | Source: Ambulatory Visit | Attending: Internal Medicine | Admitting: Internal Medicine

## 2017-01-17 ENCOUNTER — Emergency Department (HOSPITAL_COMMUNITY)
Admission: EM | Admit: 2017-01-17 | Discharge: 2017-01-17 | Disposition: A | Discharge status: Home / Self Care | Payer: Medicare Other | Attending: Emergency Medicine | Admitting: Emergency Medicine

## 2017-01-17 DIAGNOSIS — I480 Paroxysmal atrial fibrillation: Secondary | ICD-10-CM | POA: Diagnosis not present

## 2017-01-17 DIAGNOSIS — Y929 Unspecified place or not applicable: Secondary | ICD-10-CM | POA: Diagnosis not present

## 2017-01-17 DIAGNOSIS — S31000A Unspecified open wound of lower back and pelvis without penetration into retroperitoneum, initial encounter: Secondary | ICD-10-CM | POA: Insufficient documentation

## 2017-01-17 DIAGNOSIS — Y939 Activity, unspecified: Secondary | ICD-10-CM | POA: Insufficient documentation

## 2017-01-17 DIAGNOSIS — J189 Pneumonia, unspecified organism: Secondary | ICD-10-CM

## 2017-01-17 DIAGNOSIS — Z01818 Encounter for other preprocedural examination: Secondary | ICD-10-CM | POA: Diagnosis not present

## 2017-01-17 DIAGNOSIS — Y999 Unspecified external cause status: Secondary | ICD-10-CM | POA: Diagnosis not present

## 2017-01-17 DIAGNOSIS — Z01810 Encounter for preprocedural cardiovascular examination: Secondary | ICD-10-CM

## 2017-01-17 DIAGNOSIS — X58XXXA Exposure to other specified factors, initial encounter: Secondary | ICD-10-CM | POA: Insufficient documentation

## 2017-01-17 DIAGNOSIS — I2510 Atherosclerotic heart disease of native coronary artery without angina pectoris: Secondary | ICD-10-CM | POA: Diagnosis not present

## 2017-01-17 HISTORY — DX: Benign prostatic hyperplasia without lower urinary tract symptoms: N40.0

## 2017-01-17 HISTORY — DX: Anxiety disorder, unspecified: F41.9

## 2017-01-17 HISTORY — DX: Acute myocardial infarction, unspecified: I21.9

## 2017-01-17 HISTORY — DX: Atherosclerotic heart disease of native coronary artery without angina pectoris: I25.10

## 2017-01-17 HISTORY — DX: Depression, unspecified: F32.A

## 2017-01-17 HISTORY — DX: Essential (primary) hypertension: I10

## 2017-01-17 HISTORY — DX: Hyperlipidemia, unspecified: E78.5

## 2017-01-17 HISTORY — DX: Chronic obstructive pulmonary disease, unspecified: J44.9

## 2017-01-17 HISTORY — DX: Other constipation: K59.09

## 2017-01-17 HISTORY — DX: Major depressive disorder, single episode, unspecified: F32.9

## 2017-01-17 HISTORY — DX: Bipolar disorder, unspecified: F31.9

## 2017-01-17 NOTE — ED Triage Notes (Signed)
Brought down by [facility] for surgical consult for colostomy due to large sacral wound.

## 2017-01-17 NOTE — ED Notes (Signed)
picc line dressing changed using sterile technique by Tori P.  Unsure when it was changed last unable to read date.

## 2017-01-17 NOTE — Consult Note (Signed)
Cardiology Consultation:   Patient ID: Christopher Branch; [id number]; 11/01/1950   Admit date: 01/17/2017 Date of Consult: 01/17/2017  Primary Care Provider: System, Pcp Not In Primary Cardiologist: Dr. Gilmore LarocheAkhtar Lacy Branch([place], [place])   Patient Profile:   Christopher Branch is a 66 y.o. male with a hx of CAD status post CABG in 2004, COPD, OSA, CVA s/p left CEA hypertension, hyperlipidemia, chronic thrombocytopenia and recent complex admission who is being seen today for the evaluation of preoperative clearance at the request of Dr. Sharyon MedicusHijazi.   Last seen by his primary cardiologist is 03/2016.  Echocardiogram 09/2016 showed normal LV function with moderate LVH.    History of Present Illness:   Mr. Christopher PullingRich found unconscious at group home October 2018.  [place]  Workup revealed large PE status post thrombolytic therapy.  Started on heparin and eventually transitioned to Eliquis.  Hospital course was complicated by VDR F requiring trach placement.  He was also found to have A. fib intermittently during admission.  He was also transfused red blood cell for anemia.  [facility]  He was seen by GI 12/27/16 for anemia and low hemoglobin. S/p transfusion.  Did not recommended invasive therapy and anticoagulation resumed.  Patient maintained sinus rhythm at long-term care facility.  [facility] sent [facility] for consult for diverting colostomy for sacral wound.  Likely surgical intervention as outpatient.  Cardiology is consulted for preoperative clearance.  Potassium of 4.2.  Creatinine 1.57.  Hemoglobin 9.1.  Platelets 130.  Past Medical History:  Diagnosis Date  . Anxiety and depression   . Bipolar 1 disorder (HCC)   . BPH (benign prostatic hyperplasia)   . CAD (coronary artery disease)   . Chronic constipation   . COPD (chronic obstructive pulmonary disease) (HCC)   . Hyperlipidemia   . Hypertension   . MI (myocardial infarction) [facility](HCC)       Past Surgical History:  Procedure Laterality Date  . 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 CHOLECYSTECTOMY    . GASTROSTOMY    . NEPHRECTOMY Left   . TRACHEOSTOMY       Home Medications:  Prior to Admission medications   Not on File    Inpatient Medications: Scheduled Meds:  Continuous Infusions:  PRN Meds:   Allergies:    Allergies  Allergen Reactions  . Penicillins Other (See Comments)    unknon\wn    Social History:   Social History   Socioeconomic History  . Marital status: Unknown    Spouse name: Not on file  . Number of children: Not on file  . Years of education: Not on file  . Highest education level: Not on file  Social Needs  . Financial resource strain: Not on file  . Food insecurity - worry: Not on file  . Food insecurity - inability: Not on file  . Transportation needs - medical: Not on file  . Transportation needs - non-medical: Not on file  Occupational History  . Not on file  Tobacco Use  . Smoking status: Unknown If Ever Smoked  Substance and Sexual Activity  . Alcohol use: No    Frequency: Never  . Drug use: No  . Sexual activity: Not on file  Other Topics Concern  . Not on file  Social History Narrative  . Not on file    Family History:   No family history on file.  The patient  is unable to provide history due to lethargy  ROS:  Please see the history of present illness.  ROS All other ROS reviewed and negative.     Physical Exam/Data:  There were no vitals filed for this visit. No intake or output data in the 24 hours ending 01/17/17 1437 There were no vitals filed for this visit. There is no height or weight on file to calculate BMI.   Review of vital signs from ER visit earlier this morning  General: Chronically ill appearing male in no acute distress HEENT: Tracheostomy Lymph: no adenopathy Neck: no JVD Endocrine:  No thryomegaly Vascular: No carotid bruits; FA pulses 2+  bilaterally without bruits  Cardiac:  normal S1, S2; RRR; no murmur  Lungs:  clear to auscultation bilaterally, no wheezing, rhonchi or rales  Abd: soft, PEG tube Ext: no edema Musculoskeletal:  No deformities, BUE and BLE strength normal and equal Skin: warm and dry  Neuro: no focal abnormalities noted, lethargic  psych: Lethargic  EKG:  The EKG was personally reviewed and demonstrates: 12/24/2016 -sinus rhythm at rate of 98 bpm with Q waves inferiorly Telemetry:  Telemetry was personally reviewed and demonstrates: Sinus rhythm  Relevant CV Studies:   Echo 11/13/16 IMPRESSIONS  Moderate left ventricular hypertrophy.   Right ventricular systolic pressure estimated to be elevated at 50.5 mmHg.  Normal left ventricular ejection fraction estimated at 55% .  Mild-to-moderate tricuspid regurgitation.  ICU bedside echo study, patient on vent.  FINDINGS  Left Ventricle ICU bedside echo study, patient on vent.  Normal left ventricular size, systolic function. Moderate left ventricular hypertrophy.  Normal left ventricular ejection fraction estimated at 55% .  Right Ventricle Right ventricle not well visualized.  Right Atrium Normal right atrial size.  Left Atrium Normal left atrial size.  Mitral Valve Normal function and mobility of the mitral valve leaflets.  Aortic Valve Aortic valve appears to be sclerotic without evidence of stenosis.  Tricuspid Valve Mild-to-moderate tricuspid regurgitation.  Right ventricular systolic pressure estimated to be elevated at 50.5 mmHg.  Moderate pulmonary hypertension.  Pulmonic Valve Pulmonic valve not well visualized.  Trace pulmonic regurgitation.  Pericardium Normal pericardium.  Cath 04/2012 PROCEDURE PERFORMED: 1. Right heart catheterization. 2. Left heart catheterization. 3. Simultaneous pressure measurements of LV and RV to rule out    constriction. PRE-PROCEDURE DIAGNOSIS:  Congestive heart failure/fluid overload. INDICATIONS FOR  THE PROCEDURE:  The patient is a very pleasant 66 year old gentleman who underwent bypass surgery relatively recently and has had progressive fluid overload and weight gain.  He has been diuresed while he has been in the hospital, and is referred for right heart catheterization as well as left heart catheterization to evaluate volume status as well as pulmonary pressures as well as to rule out any constriction. ACCESS SITE:  Right femoral artery and right femoral vein. CLOSURE DEVICE:  Manual pressure. HEMODYNAMIC FINDINGS: 1. Right atrial pressure with mean of 8 mmHg. 2. RV pressure 36/11. 3. PA pressure 41/15 with a mean of 26 mmHg. 4. Wedge pressure: V-wave of 14, A-wave of 12 and a mean of 10 mmHg, LVEDP    of 10-12 mmHg. 5. Cardiac index was 3.03.  Cardiac output is 7.76. 6. PVR is 2.06 and SA saturation was 89% with a PA saturation of 61.4%.    The patient was not on any oxygen. 7. On examination of the simultaneous waveforms of both the left heart as    well as the right heart with the catheter in the RV  and LV, there was    concordance with deep respiration.  I had given the patient 250 mL bolus    of saline as well, and there remained concordance.  There was no    evidence of discordance.  Of note, the area within the RV wave form    remained similar as well, again going along with no evidence of    constriction. OPERATIVE NOTE:  The patient was brought to the cardiac catheterization lab and draped and prepped in the usual sterile fashion. Using a micropuncture technique, we gained access into the right femoral vein and right femoral artery.  We initially performed a right heart catheterization and then took a pigtail catheter through the artery into the left ventricle.  We measured pressures as described as above.  We did perform simultaneous pressure measurements with a slow sweep speed to demonstrate discordance versus concordance.  Of note, there was only  concordance. IMPRESSION/RECOMMENDATIONS: 1. Very mild pulmonary hypertension with a PA pressure of 26 mmHg. 2. LVEDP is 10-12 mmHg after 250 mL bolus of saline. 3. No evidence of constriction given concordance and no discordance noted.    Again the area in the RV did not change as well.  Stress test 07/07/2011 Impression: Study and image quality is quite poor and interpretation is highly  limited in light of patient body habitus.  Myocardial perfusion imaging is abnormal with a small, mild  inferolateral, inferoapical and apical myocardial perfusion defect  consistent with myocardial ischemia in this territory.  . Overall left ventricular systolic function was normal without  regional wall motion abnormalities (as noted above).   Laboratory Data:  Chemistry Recent Labs  Lab 01/14/17 0539 01/14/17 0916 01/16/17 0804  NA 142 143 143  K 5.4* 4.3 4.2  CL 107 106 104  CO2 31 33* 33*  GLUCOSE 81 92 85  BUN 58* 57* 58*  CREATININE 1.20 1.28* 1.57*  CALCIUM 13.5* 13.5* 13.7*  GFRNONAA >60 56* 44*  GFRAA >60 >60 51*  ANIONGAP 4* 4* 6    No results for input(s): PROT, ALBUMIN, AST, ALT, ALKPHOS, BILITOT in the last 168 hours. Hematology Recent Labs  Lab 01/12/17 1203 01/14/17 0539 01/16/17 0804  WBC 2.8* 4.0 4.7  RBC 2.74* 3.43* 3.16*  HGB 8.0* 10.0* 9.1*  HCT 26.7* 32.8* 30.4*  MCV 97.4 95.6 96.2  MCH 29.2 29.2 28.8  MCHC 30.0 30.5 29.9*  RDW 16.6* 16.4* 16.1*  PLT 106* 122* 130*   Radiology/Studies:  No results found.  Assessment and Plan:   1. CAD s/p CABG -No recent complaint of chest pain by reviewing note.  Echocardiogram 10/2016 showed normal LV function.  No change in therapy.  2.  Paroxysmal atrial fibrillation -Maintaining sinus rhythm.  Continue Eliquis for anticoagulation. CHADSVASC score of at least 6.   3. Recent PE - on Eliquis  4.  Preoperative cardiac clearance -Patient will be at high risk for any procedures/surgery given multiple  comorbidities.  Given recent PE will require uninterrupted anticoagulation.  Okay to hold Eliquis before and after procedure if needed.  Ideally would anticoagulate with IV heparin while holding Eliquis.  For questions or updates, please contact [facility] Please consult [email or web address] for contact info under Cardiology/STEMI.   Lorelei Pont, [place]  01/17/2017 2:37 PM   Personally seen and examined. Agree with above.  66 year old male with multiple comorbidities including coronary disease status post CABG with last echocardiogram in October showing normal ejection fraction, recent pulmonary embolism diagnosed in October currently  on Eliquis, paroxysmal atrial fibrillation currently in sinus rhythm who recently was seen by general surgery for potential diverting colostomy given his ongoing sacral wounds.  We were asked to see for preoperative risk stratification prior to any potential surgical procedure.  On exam, tracheostomy noted, ventilator, heart is regular rate and rhythm, prior bypass scar noted with abdominal deformity no appreciable murmurs.  No obvious JVD noted.  Trace edema lower extremities.  Skin warm.  Lab work reviewed.  Preoperative risk stratification: -He would be high risk for any surgical procedure based upon his multiple comorbidities.  From a cardiac perspective he seems to be maintaining stability i.e. in review of his most recent outpatient cardiology notes, not having any anginal chest pain.  He has had issues with chronic shortness of breath in fact underwent right heart catheterization which was negative for constrictive pericarditis.  I do not think that any further ischemic evaluation is necessary prior to intra-abdominal surgery/general anesthesia.  If it is felt necessary to proceed, he may move forward with surgery.  -Given his recent pulmonary embolism in October, I would try to avoid excessive amount of time off of anticoagulation.  Since he is  currently on Eliquis, if 3 days is requested to hold his Eliquis, one may consider giving him IV heparin bridge prior to surgery.    Recommend resuming Eliquis potentially 1 day postoperatively.  -Continue to maintain optimization of electrolytes, hemoglobin, fluid status.  Donato Schultz, MD

## 2017-01-17 NOTE — ED Provider Notes (Signed)
General surgery has seen patient and at this point no indication for acute emergent surgery.  He needs better optimization of his health per surgery and thus [facility] and outpatient surgery can be reconsulted later.   Pricilla LovelessGoldston, Alrick Cubbage, MD 01/17/17 1045

## 2017-01-17 NOTE — Discharge Instructions (Signed)
No surgery at this time. Needs health optimized and cardiac clearance per surgery. Reconsult general surgery when appropriate for outpatient surgery

## 2017-01-17 NOTE — Consult Note (Signed)
[facility] Surgery Consult Note  Christopher Branch [facility] Sep 11, 1950  [id number].    Requesting MD: Regenia Skeeter Chief Complaint/Reason for Consult: diverting colostomy HPI:  Patient is a 66 y/o male with MMP [facility] for surgical evaluation for diverting colostomy. Patient has a sacral wound. Patient communicates some but it is not coherent in speech. Patient unable to tell me what he wants.   ROS: Review of Systems  Unable to perform ROS: Patient nonverbal    No family history on file.  Past Medical History:  Diagnosis Date  . Anxiety and depression   . Bipolar 1 disorder ([place])   . BPH (benign prostatic hyperplasia)   . CAD (coronary artery disease)   . Chronic constipation   . COPD (chronic obstructive pulmonary disease) ([place])   . Hyperlipidemia   . Hypertension   . MI (myocardial infarction) [facility])     Past Surgical History:  Procedure Laterality Date  . 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 CHOLECYSTECTOMY    . GASTROSTOMY    . NEPHRECTOMY Left   . TRACHEOSTOMY      Social History:  reports that he does not drink alcohol or use drugs. His tobacco history is not on file.  Allergies:  Allergies  Allergen Reactions  . Penicillins Other (See Comments)    unknon\wn     (Not in a hospital admission)  Blood pressure 107/64, pulse (!) 52, resp. rate 14, height _0  (1.803 m), weight 96.6 kg (213 lb), SpO2 99 %. Physical Exam: Physical Exam  Constitutional: He appears well-developed. He appears lethargic. He is easily aroused.  Non-toxic appearance. He has a sickly appearance.  HENT:  Head: Normocephalic and atraumatic.  Right Ear: External ear normal.  Left Ear: External ear normal.  Nose: Nose normal.  Mouth/Throat: Mucous membranes are dry.  Eyes: Conjunctivae and lids are normal. No scleral icterus.  Neck: Neck supple.  Trach collar  Cardiovascular: Normal rate and regular rhythm.   Pulses:      Radial pulses are 2+ on the right side, and 2+ on the left side.  Pulmonary/Chest: Effort normal. He has rhonchi (bilateral).  Abdominal: Soft. Bowel sounds are normal. He exhibits no distension. There is no tenderness.  Obese, PEG in place without dressing, mild erythema around PEG site  Neurological: He is easily aroused. He appears lethargic.  Patient speaking some, but difficult to understand. Did not follow commands for me.   Skin: Skin is warm and dry. No rash noted.    Results for orders placed or performed during the hospital encounter of 12/08/16 (from the past 48 hour(s))  CBC     Status: Abnormal   Collection Time: 01/16/17  8:04 AM  Result Value Ref Range   WBC 4.7 4.0 - 10.5 K/uL   RBC 3.16 (L) 4.22 - 5.81 MIL/uL   Hemoglobin 9.1 (L) 13.0 - 17.0 g/dL   HCT 30.4 (L) 39.0 - 52.0 %   MCV 96.2 78.0 - 100.0 fL   MCH 28.8 26.0 - 34.0 pg   MCHC 29.9 (L) 30.0 - 36.0 g/dL   RDW 16.1 (H) 11.5 - 15.5 %   Platelets 130 (L) 150 - 400 K/uL  Basic metabolic panel     Status: Abnormal   Collection Time: 01/16/17  8:04 AM  Result Value Ref Range   Sodium 143 135 - 145 mmol/L   Potassium 4.2 3.5 - 5.1 mmol/L   Chloride 104 101 -  111 mmol/L   CO2 33 (H) 22 - 32 mmol/L   Glucose, Bld 85 65 - 99 mg/dL   BUN 58 (H) 6 - 20 mg/dL   Creatinine, Ser 1.57 (H) 0.61 - 1.24 mg/dL   Calcium 13.7 (HH) 8.9 - 10.3 mg/dL    Comment: CRITICAL RESULT CALLED TO, READ BACK BY AND VERIFIED WITH: M.SIINIVASAN RN @ 0915 01/16/17 BY C.EDENS    GFR calc non Af Amer 44 (L) >60 mL/min   GFR calc Af Amer 51 (L) >60 mL/min    Comment: (NOTE) The eGFR has been calculated using the CKD EPI equation. This calculation has not been validated in all clinical situations. eGFR's persistently <60 mL/min signify possible Chronic Kidney Disease.    Anion gap 6 5 - 15  Magnesium     Status: None   Collection Time: 01/16/17  8:04 AM  Result Value Ref Range   Magnesium 2.1 1.7 - 2.4 mg/dL  Phosphorus      Status: None   Collection Time: 01/16/17  8:04 AM  Result Value Ref Range   Phosphorus 3.9 2.5 - 4.6 mg/dL   No results found.    Assessment/Plan Request for diverting colostomy to help with sacral wound healing - patient has multiple medical problems - would need to be cleared by cardiology and surgically optimized with nutrition and correction of any aberrant labs prior to considering scheduling for surgery - would need to be off anticoagulation 2-3 days prior to surgery and 2-3 days after - Unable to provide any surgical intervention at this time, if patient deemed ok for surgery can follow up to schedule electively.  Brigid Re, [facility] Surgery 01/17/2017, 10:32 AM Pager: [phone] Consults: [phone] Mon-Fri 7:00 am-4:30 pm Sat-Sun 7:00 am-11:30 am

## 2017-01-17 NOTE — ED Provider Notes (Signed)
  [facility]   CSN: [id number] Arrival date & time: 01/17/17  0605     History   Chief Complaint Chief Complaint  Patient presents with  . surgery consult   Level 5 caveat patient noncommunicative HPI Christopher Branch is a 66 y.o. male.  HPI  Patient is a 66 year old male with multiple medical conditions [facility]. Patient has a large sacral wound that is not healing, and he is to be evaluated to have a colostomy placed.  No other details are known at this time.  Patient is unable to provide any details  PMH -COPD, CAD, ventilator dependent Soc hx - unknown  Home Medications    Prior to Admission medications   Not on File    Family History No family history on file.  Social History Social History   Tobacco Use  . Smoking status: Not on file  Substance Use Topics  . Alcohol use: Not on file  . Drug use: Not on file     Allergies   Penicillins   Review of Systems Review of Systems  Unable to perform ROS: Patient nonverbal     Physical Exam Updated Vital Signs BP (!) 106/57   Pulse (!) 55   Resp (!) 21   Ht 1.803 m (5\' 11" )   Wt 96.6 kg (213 lb)   SpO2 100%   BMI 29.71 kg/m   Physical Exam CONSTITUTIONAL: Chronically ill-appearing HEAD: Normocephalic/atraumatic EYES: EOMI ENMT: Mucous membranes moist NECK:trach in place CV: S1/S2 noted, distant heart sounds LUNGS: Lungs are clear to auscultation bilaterally, no apparent distress ABDOMEN: soft, nontender, PEG tube in place GU:no cva tenderness NEURO: Pt is awake, alert EXTREMITIES:  full ROM SKIN: warm, color normal, large bandage noted to sacral region PSYCH: unable to assess   ED Treatments / Results  Labs (all labs ordered are listed, but only abnormal results are displayed) Labs Reviewed  BASIC METABOLIC PANEL  CBC WITH DIFFERENTIAL/PLATELET  TYPE AND SCREEN    EKG  EKG Interpretation None       Radiology No results found.  Procedures Procedures (including critical care time)  Medications Ordered in ED Medications - No data to display   Initial Impression / Assessment and Plan / ED Course  I have reviewed the triage vital signs and the nursing notes.       Per report, patient is to have a surgical consult for colostomy Spoke to surgery, Dr. Magnus IvanBlackman, will consult in the ED  Final Clinical Impressions(s) / ED Diagnoses   Final diagnoses:  None    ED Discharge Orders    None       Zadie RhineWickline, Angelmarie Ponzo, MD 01/17/17 [phone]

## 2017-01-18 LAB — RENAL FUNCTION PANEL
ALBUMIN: 1.9 g/dL — AB (ref 3.5–5.0)
Anion gap: 9 (ref 5–15)
BUN: 68 mg/dL — AB (ref 6–20)
CALCIUM: 13.4 mg/dL — AB (ref 8.9–10.3)
CO2: 31 mmol/L (ref 22–32)
Chloride: 103 mmol/L (ref 101–111)
Creatinine, Ser: 1.9 mg/dL — ABNORMAL HIGH (ref 0.61–1.24)
GFR calc Af Amer: 40 mL/min — ABNORMAL LOW (ref >60)
GFR calc non Af Amer: 35 mL/min — ABNORMAL LOW (ref >60)
GLUCOSE: 98 mg/dL (ref 65–99)
PHOSPHORUS: 3.1 mg/dL (ref 2.5–4.6)
Potassium: 3.6 mmol/L (ref 3.5–5.1)
SODIUM: 143 mmol/L (ref 135–145)

## 2017-01-18 LAB — CBC
HCT: 27.1 % — ABNORMAL LOW (ref 39.0–52.0)
Hemoglobin: 8.2 g/dL — ABNORMAL LOW (ref 13.0–17.0)
MCH: 28.5 pg (ref 26.0–34.0)
MCHC: 30.3 g/dL (ref 30.0–36.0)
MCV: 94.1 fL (ref 78.0–100.0)
PLATELETS: 104 10*3/uL — AB (ref 150–400)
RBC: 2.88 MIL/uL — ABNORMAL LOW (ref 4.22–5.81)
RDW: 15.9 % — AB (ref 11.5–15.5)
WBC: 4 10*3/uL (ref 4.0–10.5)

## 2017-01-18 LAB — MAGNESIUM: MAGNESIUM: 2.1 mg/dL (ref 1.7–2.4)

## 2017-01-19 LAB — CBC
HEMATOCRIT: 24.9 % — AB (ref 39.0–52.0)
Hemoglobin: 7.6 g/dL — ABNORMAL LOW (ref 13.0–17.0)
MCH: 29 pg (ref 26.0–34.0)
MCHC: 30.5 g/dL (ref 30.0–36.0)
MCV: 95 fL (ref 78.0–100.0)
PLATELETS: 94 10*3/uL — AB (ref 150–400)
RBC: 2.62 MIL/uL — AB (ref 4.22–5.81)
RDW: 16.5 % — AB (ref 11.5–15.5)
WBC: 3.8 10*3/uL — AB (ref 4.0–10.5)

## 2017-01-19 LAB — RENAL FUNCTION PANEL
Albumin: 1.8 g/dL — ABNORMAL LOW (ref 3.5–5.0)
Anion gap: 7 (ref 5–15)
BUN: 70 mg/dL — AB (ref 6–20)
CHLORIDE: 103 mmol/L (ref 101–111)
CO2: 32 mmol/L (ref 22–32)
CREATININE: 2.04 mg/dL — AB (ref 0.61–1.24)
Calcium: 12.7 mg/dL — ABNORMAL HIGH (ref 8.9–10.3)
GFR calc Af Amer: 37 mL/min — ABNORMAL LOW (ref >60)
GFR calc non Af Amer: 32 mL/min — ABNORMAL LOW (ref >60)
Glucose, Bld: 109 mg/dL — ABNORMAL HIGH (ref 65–99)
POTASSIUM: 3.1 mmol/L — AB (ref 3.5–5.1)
Phosphorus: 2.9 mg/dL (ref 2.5–4.6)
Sodium: 142 mmol/L (ref 135–145)

## 2017-01-19 LAB — MAGNESIUM: Magnesium: 2.2 mg/dL (ref 1.7–2.4)

## 2017-01-20 LAB — RENAL FUNCTION PANEL
ALBUMIN: 1.8 g/dL — AB (ref 3.5–5.0)
ANION GAP: 5 (ref 5–15)
BUN: 78 mg/dL — ABNORMAL HIGH (ref 6–20)
CALCIUM: 12.6 mg/dL — AB (ref 8.9–10.3)
CO2: 34 mmol/L — ABNORMAL HIGH (ref 22–32)
CREATININE: 2.17 mg/dL — AB (ref 0.61–1.24)
Chloride: 104 mmol/L (ref 101–111)
GFR calc non Af Amer: 30 mL/min — ABNORMAL LOW (ref >60)
GFR, EST AFRICAN AMERICAN: 34 mL/min — AB (ref >60)
GLUCOSE: 115 mg/dL — AB (ref 65–99)
PHOSPHORUS: 2.7 mg/dL (ref 2.5–4.6)
Potassium: 3.6 mmol/L (ref 3.5–5.1)
SODIUM: 143 mmol/L (ref 135–145)

## 2017-01-20 LAB — CBC
HEMATOCRIT: 25.9 % — AB (ref 39.0–52.0)
HEMOGLOBIN: 7.6 g/dL — AB (ref 13.0–17.0)
MCH: 27.7 pg (ref 26.0–34.0)
MCHC: 29.3 g/dL — ABNORMAL LOW (ref 30.0–36.0)
MCV: 94.5 fL (ref 78.0–100.0)
Platelets: 107 10*3/uL — ABNORMAL LOW (ref 150–400)
RBC: 2.74 MIL/uL — ABNORMAL LOW (ref 4.22–5.81)
RDW: 16.2 % — AB (ref 11.5–15.5)
WBC: 3.9 10*3/uL — AB (ref 4.0–10.5)

## 2017-01-20 LAB — MAGNESIUM: Magnesium: 2.1 mg/dL (ref 1.7–2.4)

## 2017-01-21 LAB — RENAL FUNCTION PANEL
Albumin: 2.1 g/dL — ABNORMAL LOW (ref 3.5–5.0)
Anion gap: 8 (ref 5–15)
BUN: 80 mg/dL — AB (ref 6–20)
CHLORIDE: 104 mmol/L (ref 101–111)
CO2: 31 mmol/L (ref 22–32)
CREATININE: 2.11 mg/dL — AB (ref 0.61–1.24)
Calcium: 12.6 mg/dL — ABNORMAL HIGH (ref 8.9–10.3)
GFR calc Af Amer: 36 mL/min — ABNORMAL LOW (ref >60)
GFR, EST NON AFRICAN AMERICAN: 31 mL/min — AB (ref >60)
GLUCOSE: 115 mg/dL — AB (ref 65–99)
Phosphorus: 2.4 mg/dL — ABNORMAL LOW (ref 2.5–4.6)
Potassium: 2.7 mmol/L — CL (ref 3.5–5.1)
Sodium: 143 mmol/L (ref 135–145)

## 2017-01-21 LAB — PREPARE RBC (CROSSMATCH)

## 2017-01-21 LAB — CBC
HCT: 23.4 % — ABNORMAL LOW (ref 39.0–52.0)
Hemoglobin: 6.9 g/dL — CL (ref 13.0–17.0)
MCH: 28.2 pg (ref 26.0–34.0)
MCHC: 29.5 g/dL — ABNORMAL LOW (ref 30.0–36.0)
MCV: 95.5 fL (ref 78.0–100.0)
PLATELETS: 84 10*3/uL — AB (ref 150–400)
RBC: 2.45 MIL/uL — ABNORMAL LOW (ref 4.22–5.81)
RDW: 16.3 % — AB (ref 11.5–15.5)
WBC: 2.5 10*3/uL — AB (ref 4.0–10.5)

## 2017-01-21 LAB — MAGNESIUM: MAGNESIUM: 2.2 mg/dL (ref 1.7–2.4)

## 2017-01-22 ENCOUNTER — Other Ambulatory Visit (HOSPITAL_COMMUNITY): Payer: Medicaid Other

## 2017-01-22 LAB — TYPE AND SCREEN
ABO/RH(D): AB POS
ANTIBODY SCREEN: NEGATIVE
UNIT DIVISION: 0
Unit division: 0

## 2017-01-22 LAB — CBC
HEMATOCRIT: 29.7 % — AB (ref 39.0–52.0)
HEMOGLOBIN: 9.2 g/dL — AB (ref 13.0–17.0)
MCH: 28.7 pg (ref 26.0–34.0)
MCHC: 31 g/dL (ref 30.0–36.0)
MCV: 92.5 fL (ref 78.0–100.0)
Platelets: 80 10*3/uL — ABNORMAL LOW (ref 150–400)
RBC: 3.21 MIL/uL — ABNORMAL LOW (ref 4.22–5.81)
RDW: 17.3 % — AB (ref 11.5–15.5)
WBC: 3.8 10*3/uL — AB (ref 4.0–10.5)

## 2017-01-22 LAB — RENAL FUNCTION PANEL
ALBUMIN: 2.3 g/dL — AB (ref 3.5–5.0)
ANION GAP: 8 (ref 5–15)
BUN: 85 mg/dL — ABNORMAL HIGH (ref 6–20)
CHLORIDE: 105 mmol/L (ref 101–111)
CO2: 31 mmol/L (ref 22–32)
Calcium: 13.2 mg/dL (ref 8.9–10.3)
Creatinine, Ser: 2.14 mg/dL — ABNORMAL HIGH (ref 0.61–1.24)
GFR calc Af Amer: 35 mL/min — ABNORMAL LOW (ref >60)
GFR, EST NON AFRICAN AMERICAN: 30 mL/min — AB (ref >60)
Glucose, Bld: 93 mg/dL (ref 65–99)
PHOSPHORUS: 2.1 mg/dL — AB (ref 2.5–4.6)
POTASSIUM: 4.4 mmol/L (ref 3.5–5.1)
Sodium: 144 mmol/L (ref 135–145)

## 2017-01-22 LAB — BPAM RBC
BLOOD PRODUCT EXPIRATION DATE: 201901152359
Blood Product Expiration Date: 201901122359
ISSUE DATE / TIME: 201812301403
ISSUE DATE / TIME: 201812300957
UNIT TYPE AND RH: 8400
Unit Type and Rh: 8400

## 2017-01-22 LAB — MAGNESIUM: MAGNESIUM: 2.6 mg/dL — AB (ref 1.7–2.4)

## 2017-01-23 LAB — BASIC METABOLIC PANEL
Anion gap: 7 (ref 5–15)
BUN: 82 mg/dL — AB (ref 6–20)
CO2: 34 mmol/L — ABNORMAL HIGH (ref 22–32)
CREATININE: 2.06 mg/dL — AB (ref 0.61–1.24)
Calcium: 13 mg/dL — ABNORMAL HIGH (ref 8.9–10.3)
Chloride: 103 mmol/L (ref 101–111)
GFR, EST AFRICAN AMERICAN: 37 mL/min — AB (ref >60)
GFR, EST NON AFRICAN AMERICAN: 32 mL/min — AB (ref >60)
Glucose, Bld: 115 mg/dL — ABNORMAL HIGH (ref 65–99)
POTASSIUM: 2.5 mmol/L — AB (ref 3.5–5.1)
SODIUM: 144 mmol/L (ref 135–145)

## 2017-01-24 LAB — CBC
HEMATOCRIT: 28.7 % — AB (ref 39.0–52.0)
Hemoglobin: 8.5 g/dL — ABNORMAL LOW (ref 13.0–17.0)
MCH: 27.9 pg (ref 26.0–34.0)
MCHC: 29.6 g/dL — AB (ref 30.0–36.0)
MCV: 94.1 fL (ref 78.0–100.0)
PLATELETS: 93 10*3/uL — AB (ref 150–400)
RBC: 3.05 MIL/uL — ABNORMAL LOW (ref 4.22–5.81)
RDW: 16.7 % — AB (ref 11.5–15.5)
WBC: 3.9 10*3/uL — AB (ref 4.0–10.5)

## 2017-01-24 LAB — COMPREHENSIVE METABOLIC PANEL
ALBUMIN: 2.1 g/dL — AB (ref 3.5–5.0)
ALT: 24 U/L (ref 17–63)
ANION GAP: 3 — AB (ref 5–15)
AST: 20 U/L (ref 15–41)
Alkaline Phosphatase: 76 U/L (ref 38–126)
BILIRUBIN TOTAL: 0.6 mg/dL (ref 0.3–1.2)
BUN: 71 mg/dL — ABNORMAL HIGH (ref 6–20)
CHLORIDE: 110 mmol/L (ref 101–111)
CO2: 33 mmol/L — ABNORMAL HIGH (ref 22–32)
Calcium: 12.9 mg/dL — ABNORMAL HIGH (ref 8.9–10.3)
Creatinine, Ser: 1.77 mg/dL — ABNORMAL HIGH (ref 0.61–1.24)
GFR calc Af Amer: 44 mL/min — ABNORMAL LOW (ref >60)
GFR calc non Af Amer: 38 mL/min — ABNORMAL LOW (ref >60)
GLUCOSE: 107 mg/dL — AB (ref 65–99)
POTASSIUM: 3.4 mmol/L — AB (ref 3.5–5.1)
SODIUM: 146 mmol/L — AB (ref 135–145)
Total Protein: 5.1 g/dL — ABNORMAL LOW (ref 6.5–8.1)

## 2017-01-25 LAB — BASIC METABOLIC PANEL
Anion gap: 6 (ref 5–15)
BUN: 74 mg/dL — AB (ref 6–20)
CO2: 30 mmol/L (ref 22–32)
CREATININE: 1.83 mg/dL — AB (ref 0.61–1.24)
Calcium: 13.1 mg/dL (ref 8.9–10.3)
Chloride: 106 mmol/L (ref 101–111)
GFR calc Af Amer: 42 mL/min — ABNORMAL LOW (ref >60)
GFR, EST NON AFRICAN AMERICAN: 37 mL/min — AB (ref >60)
Glucose, Bld: 110 mg/dL — ABNORMAL HIGH (ref 65–99)
Potassium: 4 mmol/L (ref 3.5–5.1)
SODIUM: 142 mmol/L (ref 135–145)

## 2017-02-23 DEATH — deceased

## 2018-07-13 IMAGING — DX DG CHEST 1V PORT
1 series · 1 of 1 positions shown · non-contrast
Comparison: 12/08/2016

CLINICAL DATA: Vomiting

EXAM:
PORTABLE CHEST 1 VIEW

[chest]
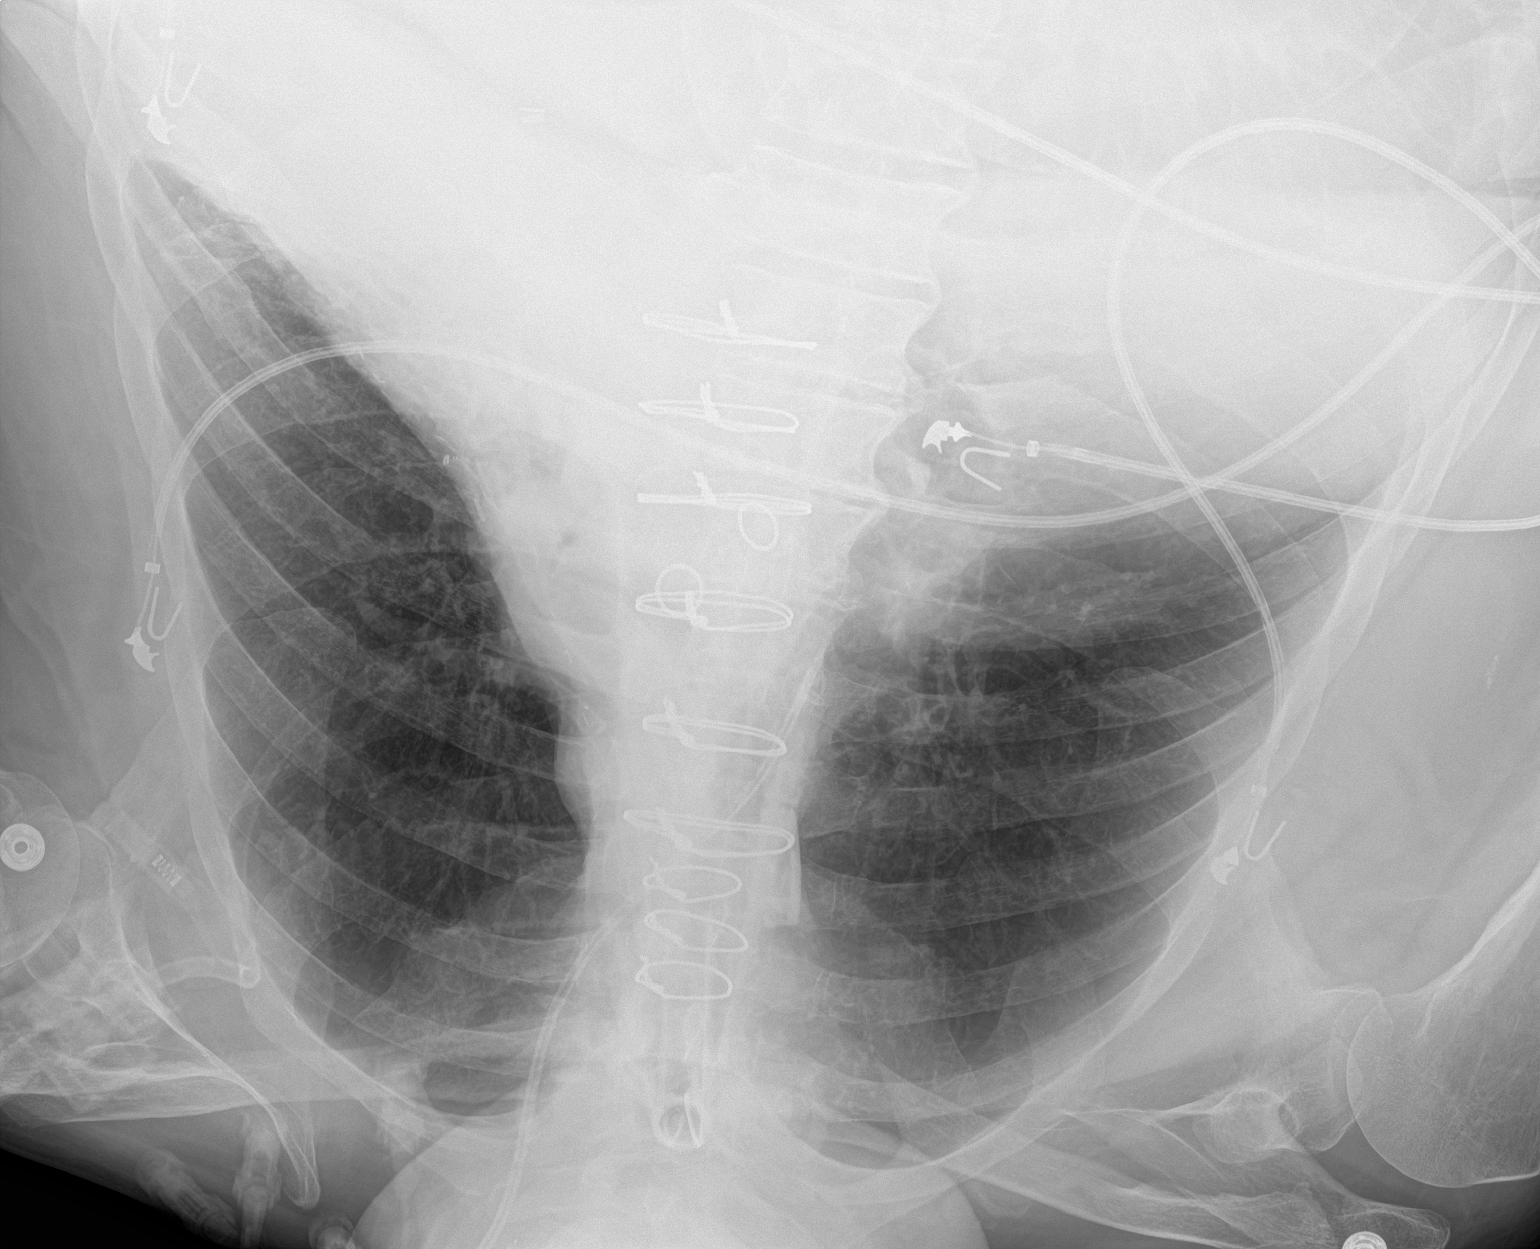

[1 of 1 positions shown; findings below may reference images not displayed]

FINDINGS: Prior CABG. Bilateral lower lobe airspace opacities. Probable
layering bilateral effusions. Tracheostomy and left central line
remain in place, unchanged.
IMPRESSION: Bilateral layering effusions with bibasilar atelectasis or
infiltrates, right greater than left. This is increasing on the left
since prior study.

## 2018-07-14 IMAGING — DX DG ABD PORTABLE 1V
1 series · 1 of 1 positions shown · non-contrast
Comparison: December 13, 2016 abdominal radiograph and abdominal CT

CLINICAL DATA: Ileus

EXAM:
PORTABLE ABDOMEN - 1 VIEW

[abdomen kub]
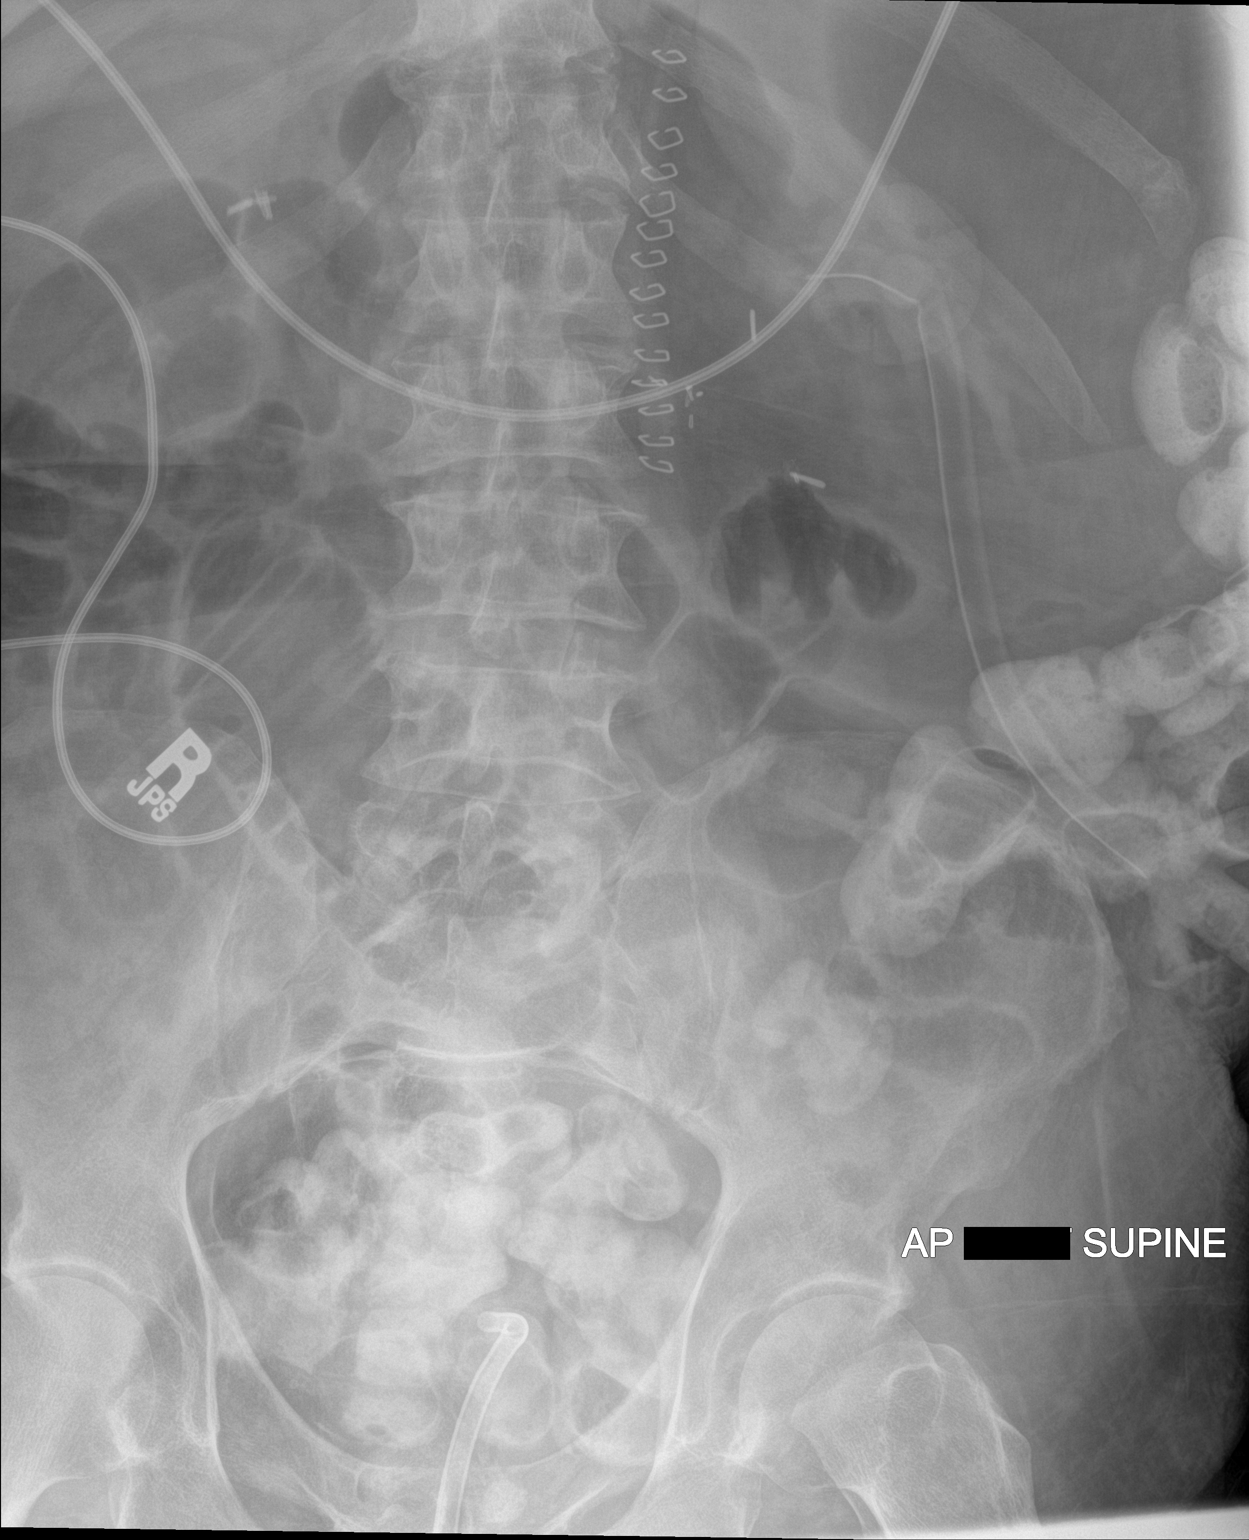

[1 of 1 positions shown; findings below may reference images not displayed]

FINDINGS: Gastrostomy catheter is position in the region of the stomach. More
gastric dilatation remains. There are loops of dilated small bowel.
Questionable transition zone noted in the distal small bowel region.
There is contrast in the nondistended descending colon and sigmoid
region. There are skin staples to the left of midline in the upper
abdominal region. Surgical clips are noted in the gallbladder fossa
region as well as in the left mid abdomen.
IMPRESSION: Marked distention of the stomach with air. Dilated small bowel with
what appears to be transition zone suggesting a degree of bowel
obstruction. No free air is demonstrated. Areas of postoperative
change noted. Gastrostomy catheter positioned in region of stomach.

## 2018-08-22 IMAGING — DX DG CHEST 1V PORT
1 series · 1 of 1 positions shown · non-contrast
Comparison: 08/15/2016.

CLINICAL DATA: Shortness of breath. Clinical concern for pneumonia.

EXAM:
PORTABLE CHEST 1 VIEW

[chest ap]
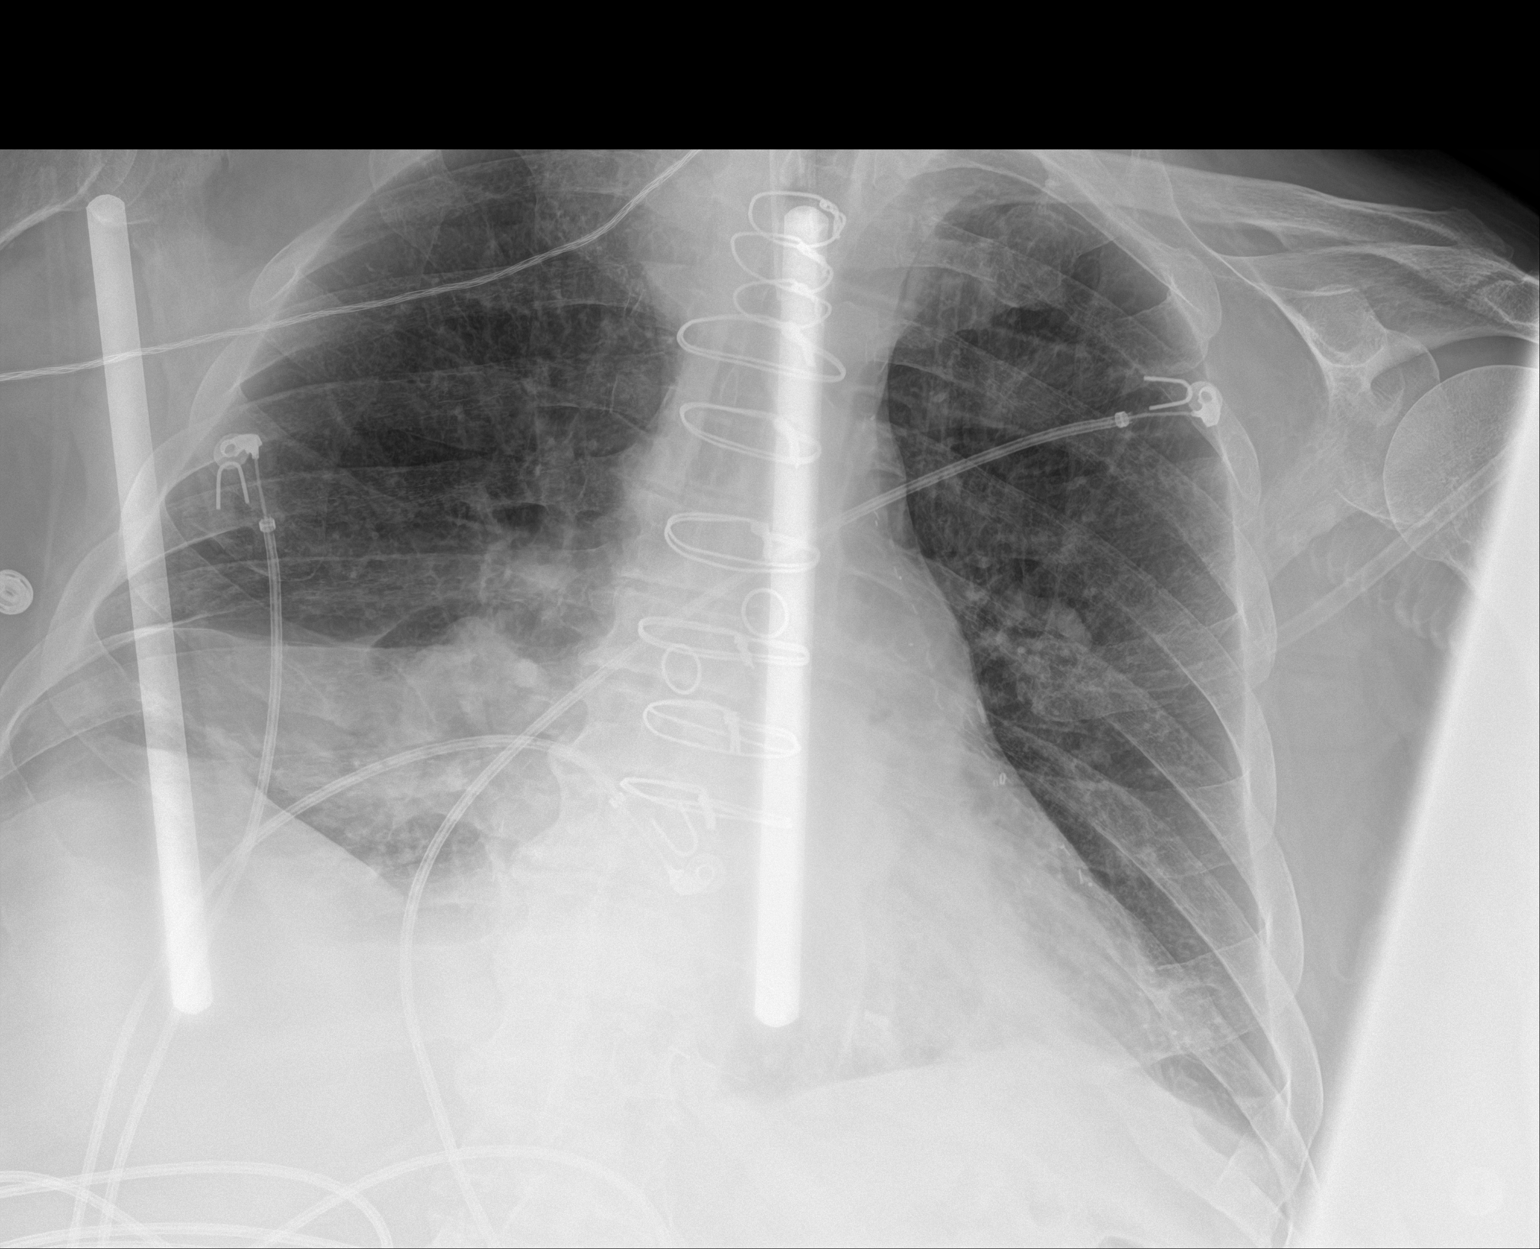

[1 of 1 positions shown; findings below may reference images not displayed]

FINDINGS: Tracheostomy tube in satisfactory position. Stable post CABG
changes. The left jugular catheter has been removed. Ill-defined
opacity at the right lung base with some improvement. Decreased
patchy opacity at the left lung base. Obscured right heart border
with no gross cardiac enlargement. Thoracic spine degenerative
changes.
IMPRESSION: Bibasilar atelectasis or pneumonia with some improvement.
